# Patient Record
Sex: Male | Born: 1940 | Race: White | Hispanic: No | Marital: Married | Smoking: Former smoker
Health system: Southern US, Community
[De-identification: ages and names within clinical notes are randomized; demographics above are authoritative.]

## PROBLEM LIST (undated history)

## (undated) DIAGNOSIS — M5030 Other cervical disc degeneration, unspecified cervical region: Secondary | ICD-10-CM

## (undated) DIAGNOSIS — K219 Gastro-esophageal reflux disease without esophagitis: Secondary | ICD-10-CM

## (undated) DIAGNOSIS — M5412 Radiculopathy, cervical region: Secondary | ICD-10-CM

## (undated) DIAGNOSIS — J302 Other seasonal allergic rhinitis: Secondary | ICD-10-CM

## (undated) DIAGNOSIS — R062 Wheezing: Secondary | ICD-10-CM

## (undated) DIAGNOSIS — S22060A Wedge compression fracture of T7-T8 vertebra, initial encounter for closed fracture: Secondary | ICD-10-CM

## (undated) DIAGNOSIS — J45909 Unspecified asthma, uncomplicated: Secondary | ICD-10-CM

## (undated) DIAGNOSIS — E221 Hyperprolactinemia: Secondary | ICD-10-CM

## (undated) DIAGNOSIS — Z9889 Other specified postprocedural states: Secondary | ICD-10-CM

## (undated) DIAGNOSIS — H9190 Unspecified hearing loss, unspecified ear: Secondary | ICD-10-CM

## (undated) DIAGNOSIS — E559 Vitamin D deficiency, unspecified: Secondary | ICD-10-CM

## (undated) DIAGNOSIS — I10 Essential (primary) hypertension: Secondary | ICD-10-CM

## (undated) DIAGNOSIS — H5000 Unspecified esotropia: Secondary | ICD-10-CM

## (undated) DIAGNOSIS — IMO0001 Reserved for inherently not codable concepts without codable children: Secondary | ICD-10-CM

## (undated) DIAGNOSIS — D352 Benign neoplasm of pituitary gland: Secondary | ICD-10-CM

## (undated) DIAGNOSIS — E229 Hyperfunction of pituitary gland, unspecified: Secondary | ICD-10-CM

## (undated) DIAGNOSIS — H5789 Other specified disorders of eye and adnexa: Secondary | ICD-10-CM

## (undated) DIAGNOSIS — M47812 Spondylosis without myelopathy or radiculopathy, cervical region: Secondary | ICD-10-CM

## (undated) DIAGNOSIS — M810 Age-related osteoporosis without current pathological fracture: Secondary | ICD-10-CM

## (undated) DIAGNOSIS — K5790 Diverticulosis of intestine, part unspecified, without perforation or abscess without bleeding: Secondary | ICD-10-CM

## (undated) DIAGNOSIS — F524 Premature ejaculation: Secondary | ICD-10-CM

## (undated) HISTORY — DX: Unspecified asthma, uncomplicated: J45.909

## (undated) HISTORY — DX: Wedge compression fracture of T7-t8 vertebra, initial encounter for closed fracture: S22.060A

## (undated) HISTORY — DX: Hyperprolactinemia: E22.1

## (undated) HISTORY — DX: Spondylosis without myelopathy or radiculopathy, cervical region: M47.812

## (undated) HISTORY — DX: Age-related osteoporosis without current pathological fracture: M81.0

## (undated) HISTORY — DX: Other specified disorders of eye and adnexa: H57.89

## (undated) HISTORY — DX: Essential (primary) hypertension: I10

## (undated) HISTORY — DX: Diverticulosis of intestine, part unspecified, without perforation or abscess without bleeding: K57.90

## (undated) HISTORY — DX: Other cervical disc degeneration, unspecified cervical region: M50.30

## (undated) HISTORY — PX: CARDIAC CATHETERIZATION: SHX172

## (undated) HISTORY — DX: Radiculopathy, cervical region: M54.12

## (undated) HISTORY — DX: Other specified postprocedural states: Z98.890

## (undated) HISTORY — PX: HEMORRHOID SURGERY: SHX153

## (undated) HISTORY — DX: Hyperfunction of pituitary gland, unspecified: E22.9

## (undated) HISTORY — PX: TONSILLECTOMY: SUR1361

## (undated) HISTORY — DX: Premature ejaculation: F52.4

## (undated) HISTORY — DX: Vitamin D deficiency, unspecified: E55.9

## (undated) HISTORY — DX: Unspecified esotropia: H50.00

## (undated) HISTORY — DX: Other seasonal allergic rhinitis: J30.2

## (undated) HISTORY — DX: Benign neoplasm of pituitary gland: D35.2

## (undated) HISTORY — PX: COLONOSCOPY: SHX174

## (undated) HISTORY — DX: Gastro-esophageal reflux disease without esophagitis: K21.9

---

## 1981-10-05 HISTORY — PX: CHOLECYSTECTOMY: SHX55

## 1998-10-09 ENCOUNTER — Ambulatory Visit (HOSPITAL_BASED_OUTPATIENT_CLINIC_OR_DEPARTMENT_OTHER): Admission: RE | Admit: 1998-10-09 | Discharge: 1998-10-09 | Payer: Self-pay | Admitting: Orthopedic Surgery

## 2000-08-19 ENCOUNTER — Ambulatory Visit (HOSPITAL_COMMUNITY): Admission: AD | Admit: 2000-08-19 | Discharge: 2000-08-19 | Payer: Self-pay | Admitting: *Deleted

## 2000-09-01 ENCOUNTER — Ambulatory Visit (HOSPITAL_COMMUNITY): Admission: RE | Admit: 2000-09-01 | Discharge: 2000-09-01 | Payer: Self-pay | Admitting: Internal Medicine

## 2001-09-18 ENCOUNTER — Encounter: Payer: Self-pay | Admitting: Emergency Medicine

## 2001-09-18 ENCOUNTER — Emergency Department (HOSPITAL_COMMUNITY): Admission: EM | Admit: 2001-09-18 | Discharge: 2001-09-18 | Payer: Self-pay | Admitting: Emergency Medicine

## 2002-05-12 ENCOUNTER — Ambulatory Visit (HOSPITAL_COMMUNITY): Admission: RE | Admit: 2002-05-12 | Discharge: 2002-05-12 | Payer: Self-pay | Admitting: Gastroenterology

## 2002-05-12 ENCOUNTER — Encounter (INDEPENDENT_AMBULATORY_CARE_PROVIDER_SITE_OTHER): Payer: Self-pay | Admitting: Specialist

## 2004-09-29 ENCOUNTER — Emergency Department (HOSPITAL_COMMUNITY): Admission: EM | Admit: 2004-09-29 | Discharge: 2004-09-29 | Payer: Self-pay | Admitting: Emergency Medicine

## 2005-08-28 ENCOUNTER — Encounter: Admission: RE | Admit: 2005-08-28 | Discharge: 2005-08-28 | Payer: Self-pay | Admitting: Internal Medicine

## 2005-10-05 HISTORY — PX: OTHER SURGICAL HISTORY: SHX169

## 2006-08-05 ENCOUNTER — Encounter: Admission: RE | Admit: 2006-08-05 | Discharge: 2006-08-05 | Payer: Self-pay | Admitting: Internal Medicine

## 2006-08-05 DIAGNOSIS — S22060A Wedge compression fracture of T7-T8 vertebra, initial encounter for closed fracture: Secondary | ICD-10-CM

## 2006-08-05 HISTORY — PX: BACK SURGERY: SHX140

## 2006-08-05 HISTORY — DX: Wedge compression fracture of T7-T8 vertebra, initial encounter for closed fracture: S22.060A

## 2006-08-25 ENCOUNTER — Ambulatory Visit (HOSPITAL_COMMUNITY): Admission: RE | Admit: 2006-08-25 | Discharge: 2006-08-25 | Payer: Self-pay | Admitting: Internal Medicine

## 2006-08-27 ENCOUNTER — Ambulatory Visit (HOSPITAL_COMMUNITY): Admission: RE | Admit: 2006-08-27 | Discharge: 2006-08-27 | Payer: Self-pay | Admitting: Radiology

## 2006-09-07 ENCOUNTER — Encounter: Admission: RE | Admit: 2006-09-07 | Discharge: 2006-09-07 | Payer: Self-pay | Admitting: Internal Medicine

## 2006-10-05 DIAGNOSIS — Z9889 Other specified postprocedural states: Secondary | ICD-10-CM

## 2006-10-05 HISTORY — DX: Other specified postprocedural states: Z98.890

## 2006-12-02 ENCOUNTER — Encounter: Admission: RE | Admit: 2006-12-02 | Discharge: 2006-12-02 | Payer: Self-pay | Admitting: Neurology

## 2006-12-03 ENCOUNTER — Encounter: Admission: RE | Admit: 2006-12-03 | Discharge: 2006-12-03 | Payer: Self-pay | Admitting: Radiology

## 2007-01-04 ENCOUNTER — Encounter: Admission: RE | Admit: 2007-01-04 | Discharge: 2007-03-25 | Payer: Self-pay | Admitting: Internal Medicine

## 2007-03-07 ENCOUNTER — Encounter: Admission: RE | Admit: 2007-03-07 | Discharge: 2007-03-07 | Payer: Self-pay | Admitting: Internal Medicine

## 2009-02-26 ENCOUNTER — Encounter: Admission: RE | Admit: 2009-02-26 | Discharge: 2009-02-26 | Payer: Self-pay | Admitting: Internal Medicine

## 2009-08-21 ENCOUNTER — Encounter: Admission: RE | Admit: 2009-08-21 | Discharge: 2009-08-21 | Payer: Self-pay | Admitting: Otolaryngology

## 2010-04-08 ENCOUNTER — Encounter: Admission: RE | Admit: 2010-04-08 | Discharge: 2010-04-08 | Payer: Self-pay | Admitting: Internal Medicine

## 2011-02-20 NOTE — Consult Note (Signed)
NAME:  Jerome, [name] NO.:  [id number]   MEDICAL RECORD NO.:  [id number]          PATIENT TYPE:  OUT   LOCATION:  MRI                          FACILITY:  [facility]   PHYSICIAN:  Jerome [name], MDDATE OF BIRTH:  Nov 04, 1940   DATE OF CONSULTATION:  08/25/2006  DATE OF DISCHARGE:                                   CONSULTATION   CHIEF COMPLAINT:  Compression fracture.   HISTORY OF PRESENT ILLNESS:  This is a very pleasant 70 year old male who  developed sudden onset of back pain in early November while carrying a heavy  box up a flight of stairs.  He had a T9 fracture by plain films and MRI was  performed today just prior to the consult.  Dr. Alfredo [name] reviewed the MRI.  He felt that the patient had a T8 compression fracture.  The patient reports  pain for the past 2-1/2 weeks.  Initially his pain was a 10 on a 1-10 scale.  It is now improved to a 6.  However, his activities have been fairly limited  by the pain.  He presents today accompanied by his wife to discuss treatment  options.   PAST MEDICAL HISTORY:  Past medical history significant for hypertension,  diverticular disease, osteoarthritis, allergic rhinitis, history of cervical  radiculopathy, history of asthma.  He had a history of a right eye  hemorrhage treated with laser surgery.  He had a cardiac catheterization in  November 2001 that was essentially normal, this was performed by Dr. Meade Maw.  He has a history of spinal stenosis at L3 and L4 by his MRI  performed November 2006.   PAST SURGICAL HISTORY:  Surgical history is significant for cholecystectomy,  tonsillectomy, hemorrhoid surgery, he had a cyst removed from his right  second finger, he denies any previous problems with anesthesia.   ALLERGIES:  NO KNOWN DRUG ALLERGIES.  HE DID AT ONE TIME HAVE HIVES AFTER  EATING A CRAB.  HE HAS SINCE EATEN CRAB MEAT WITHOUT ANY REACTION.   CURRENT MEDICATIONS INCLUDE:  Advair, Singulair,  Metamucil, Prilosec,  Lotrisone, multivitamins, glucosamine with chondroitin, potassium and  hydrochlorothiazide.   SOCIAL HISTORY:  The patient is married.  He has two children.  He has one  or two alcoholic beverages per week.  He used to smoke cigars in the past  however, he has since quit.  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  He lives in [place].   FAMILY HISTORY:  His mother died at age 41 from pneumonia.  She also had  colon cancer, hypertension, Parkinson's disease and severe osteoporosis.  His father died at age 29 from a CVA.   IMPRESSION AND PLAN:  As noted the patient presents today accompanied by his  wife to discuss treatment options for a T8 compression fracture.  Dr.  Alfredo [name] reviewed his MRI with the patient and his wife.  He pointed out the  area of fracture.  He discussed treatment options including kyphoplasty,  vertebroplasty or more conservative therapy with continued pain medication  management and no intervention.  The procedures were described in detail  along with the risks and benefits.  The patient is anxious to proceed with  the vertebroplasty for relief of pain and stabilization of the fracture.  Intervention has been scheduled for Friday, August 27, 2006 with Dr.  Alfredo [name].  Greater than 40 minutes was spent on this consult.      Delton See, P.A.    ______________________________  Jerome Roberts, MD    DR/MEDQ  D:  08/25/2006  T:  08/25/2006  Job:  [id number]   cc:   Thora Lance, M.D.

## 2011-02-20 NOTE — Cardiovascular Report (Signed)
[address]. [facility]  Patient:    Jerome [name], Jerome [name]                MRN: [id number] Proc. Date: 08/19/00 Adm. Date:  [id number] Attending:  Meade Maw A CC:         Thora Lance, M.D.   Cardiac Catheterization  INDICATIONS:  Chest pain with reversible ischemia in the inferior apical region.  DESCRIPTION OF PROCEDURE:  After obtaining written and informed consent, the patient was brought to the cardiac catheterization lab in the postabsorptive state.  Preoperative sedation was achieved using IV Versed.  The right femoral head was identified using radiographic technique.  Local anesthesia was achieved using 1% Xylocaine.  6 French hemostasis sheath was placed into the right femoral artery using modified Seldinger technique.  Selective coronary angiography was performed using a JL4 and JR4 Judkins catheter and ionic contrast was used.  All catheter exchanges were made over a guide wire. Single plane ventriculogram was performed in the RAO position using a 6 French pigtail curved catheter.  Following the procedure, there was no identifiable coronary artery disease.  The hemostasis sheath was removed.  The patient was transferred to the holding area in satisfactory condition.  FINDINGS:  The aortic pressure was 119/77, LV pressure 119/10.  There was no gradient on pullback.  Single plane ventriculogram revealed normal wall motion with an ejection fraction of 70%, no mitral regurgitation was noted.  CORONARY ANGIOGRAPHY:  The left main coronary artery was short and bifurcated into the left anterior descending and circumflex vessel.  Left anterior descending.  The Left anterior descending was a large artery and gave rise to a large D1, small D2, small D3, and ended as an apical recurrent branch.  There was no significant disease in the left anterior descending or its branches.  Circumflex vessel.  The circumflex vessel was codominant and gave rise  to a large OM1, OM2, OM3, and went on to end as an AV groove vessel.  There was no significant disease in the circumflex or its branches.  Right coronary artery was codominant, small RV marginal 1, small RV marginal 2, moderate PDA, and large PL branch.  There was no significant disease in the right coronary artery or its branches.  IMPRESSION:  Normal coronary artery, false positive stress Cardiolite, normal single plane ventriculogram without mitral regurgitation.  RECOMMENDATION:  Consider other etiologies for his chest pain.  Gastritis, continue with Protonix.  These findings were discussed with both the patient and his wife. DD:  08/19/00 TD:  08/19/00 Job: [id number]

## 2012-12-08 ENCOUNTER — Ambulatory Visit: Payer: Medicare Other | Attending: Internal Medicine

## 2012-12-08 DIAGNOSIS — IMO0001 Reserved for inherently not codable concepts without codable children: Secondary | ICD-10-CM | POA: Insufficient documentation

## 2012-12-08 DIAGNOSIS — R262 Difficulty in walking, not elsewhere classified: Secondary | ICD-10-CM | POA: Insufficient documentation

## 2012-12-08 DIAGNOSIS — M2560 Stiffness of unspecified joint, not elsewhere classified: Secondary | ICD-10-CM | POA: Insufficient documentation

## 2012-12-08 DIAGNOSIS — R293 Abnormal posture: Secondary | ICD-10-CM | POA: Insufficient documentation

## 2012-12-08 DIAGNOSIS — M2550 Pain in unspecified joint: Secondary | ICD-10-CM | POA: Insufficient documentation

## 2012-12-12 ENCOUNTER — Ambulatory Visit: Payer: Medicare Other | Admitting: Physical Therapy

## 2012-12-15 ENCOUNTER — Ambulatory Visit: Payer: Medicare Other | Admitting: Physical Therapy

## 2012-12-19 ENCOUNTER — Ambulatory Visit: Payer: Medicare Other

## 2012-12-22 ENCOUNTER — Ambulatory Visit: Payer: Medicare Other | Admitting: Physical Therapy

## 2012-12-26 ENCOUNTER — Ambulatory Visit: Payer: Medicare Other

## 2012-12-28 ENCOUNTER — Ambulatory Visit: Payer: Medicare Other | Admitting: Physical Therapy

## 2013-01-04 ENCOUNTER — Ambulatory Visit: Payer: Medicare Other | Attending: Internal Medicine

## 2013-01-04 DIAGNOSIS — M2550 Pain in unspecified joint: Secondary | ICD-10-CM | POA: Insufficient documentation

## 2013-01-04 DIAGNOSIS — R262 Difficulty in walking, not elsewhere classified: Secondary | ICD-10-CM | POA: Insufficient documentation

## 2013-01-04 DIAGNOSIS — IMO0001 Reserved for inherently not codable concepts without codable children: Secondary | ICD-10-CM | POA: Insufficient documentation

## 2013-01-04 DIAGNOSIS — R293 Abnormal posture: Secondary | ICD-10-CM | POA: Insufficient documentation

## 2013-01-04 DIAGNOSIS — M2560 Stiffness of unspecified joint, not elsewhere classified: Secondary | ICD-10-CM | POA: Insufficient documentation

## 2013-01-11 ENCOUNTER — Ambulatory Visit: Payer: Medicare Other | Admitting: Physical Therapy

## 2013-01-18 ENCOUNTER — Ambulatory Visit: Payer: Medicare Other | Admitting: Physical Therapy

## 2013-05-05 DEATH — deceased

## 2014-07-09 ENCOUNTER — Telehealth: Payer: Self-pay | Admitting: Hematology

## 2014-07-09 NOTE — Telephone Encounter (Signed)
C/D 10/5 for appt. 10/15

## 2014-07-09 NOTE — Telephone Encounter (Signed)
S/W PATIENT AND GAVE NP APPT FOR 10/15 @ 1:30 W/DR. SEHBAI. REFERRING DR. Jenny Reichmann GRIFFIN DX- BLEEDING EASILY

## 2014-07-19 ENCOUNTER — Ambulatory Visit (HOSPITAL_BASED_OUTPATIENT_CLINIC_OR_DEPARTMENT_OTHER): Payer: Medicare Other

## 2014-07-19 ENCOUNTER — Other Ambulatory Visit (HOSPITAL_BASED_OUTPATIENT_CLINIC_OR_DEPARTMENT_OTHER): Payer: Medicare Other

## 2014-07-19 ENCOUNTER — Telehealth: Payer: Self-pay | Admitting: Hematology

## 2014-07-19 ENCOUNTER — Ambulatory Visit (HOSPITAL_BASED_OUTPATIENT_CLINIC_OR_DEPARTMENT_OTHER): Payer: Medicare Other | Admitting: Hematology

## 2014-07-19 ENCOUNTER — Ambulatory Visit: Payer: Medicare Other

## 2014-07-19 VITALS — BP 148/90 | HR 88 | Temp 97.8°F | Resp 18 | Ht 70.0 in | Wt 166.6 lb

## 2014-07-19 DIAGNOSIS — D699 Hemorrhagic condition, unspecified: Secondary | ICD-10-CM

## 2014-07-19 DIAGNOSIS — M810 Age-related osteoporosis without current pathological fracture: Secondary | ICD-10-CM | POA: Insufficient documentation

## 2014-07-19 DIAGNOSIS — Z9889 Other specified postprocedural states: Secondary | ICD-10-CM

## 2014-07-19 DIAGNOSIS — K068 Other specified disorders of gingiva and edentulous alveolar ridge: Secondary | ICD-10-CM

## 2014-07-19 DIAGNOSIS — I10 Essential (primary) hypertension: Secondary | ICD-10-CM | POA: Insufficient documentation

## 2014-07-19 DIAGNOSIS — J45998 Other asthma: Secondary | ICD-10-CM | POA: Insufficient documentation

## 2014-07-19 DIAGNOSIS — K055 Other periodontal diseases: Secondary | ICD-10-CM

## 2014-07-19 LAB — CBC WITH DIFFERENTIAL/PLATELET
BASO%: 1 % (ref 0.0–2.0)
BASOS ABS: 0.1 10*3/uL (ref 0.0–0.1)
EOS%: 2.8 % (ref 0.0–7.0)
Eosinophils Absolute: 0.2 10*3/uL (ref 0.0–0.5)
HEMATOCRIT: 49.8 % (ref 38.4–49.9)
HGB: 16.4 g/dL (ref 13.0–17.1)
LYMPH#: 1.3 10*3/uL (ref 0.9–3.3)
LYMPH%: 18.9 % (ref 14.0–49.0)
MCH: 30.1 pg (ref 27.2–33.4)
MCHC: 32.9 g/dL (ref 32.0–36.0)
MCV: 91.3 fL (ref 79.3–98.0)
MONO#: 0.6 10*3/uL (ref 0.1–0.9)
MONO%: 9 % (ref 0.0–14.0)
NEUT#: 4.6 10*3/uL (ref 1.5–6.5)
NEUT%: 68.3 % (ref 39.0–75.0)
Platelets: 331 10*3/uL (ref 140–400)
RBC: 5.45 10*6/uL (ref 4.20–5.82)
RDW: 13.7 % (ref 11.0–14.6)
WBC: 6.8 10*3/uL (ref 4.0–10.3)

## 2014-07-19 LAB — PLATELET FUNCTION ASSAY: COLLAGEN / EPINEPHRINE: 109 s (ref 0–184)

## 2014-07-19 LAB — COMPREHENSIVE METABOLIC PANEL (CC13)
ALK PHOS: 67 U/L (ref 40–150)
ALT: 21 U/L (ref 0–55)
AST: 19 U/L (ref 5–34)
Albumin: 3.9 g/dL (ref 3.5–5.0)
Anion Gap: 11 mEq/L (ref 3–11)
BILIRUBIN TOTAL: 0.55 mg/dL (ref 0.20–1.20)
BUN: 11.8 mg/dL (ref 7.0–26.0)
CHLORIDE: 101 meq/L (ref 98–109)
CO2: 29 mEq/L (ref 22–29)
CREATININE: 0.8 mg/dL (ref 0.7–1.3)
Calcium: 10.1 mg/dL (ref 8.4–10.4)
Glucose: 87 mg/dl (ref 70–140)
POTASSIUM: 3.7 meq/L (ref 3.5–5.1)
Sodium: 142 mEq/L (ref 136–145)
TOTAL PROTEIN: 7.2 g/dL (ref 6.4–8.3)

## 2014-07-19 NOTE — Progress Notes (Signed)
[name] CONSULT NOTE DATE OF VISIT: 07/19/2014  Patient Care Team: Irven Shelling, MD as PCP - General (Internal Medicine) Irven Shelling, MD as Referring Physician (Internal Medicine) Marcella Dunnaway Marla Roe, MD as Consulting Physician (Hematology) [facility]  CHIEF COMPLAINTS/PURPOSE OF CONSULTATION:   Excessive bleeding with last dental procedure  HISTORY OF PRESENTING ILLNESS:   Jerome Bell 73 y.o. male is here because of evaluation for a bleeding diathesis. Patient does not have any prior or known history of a bleeding disorder. He has had several surgeries in the past which are listed below and none of them resulted in a bleeding complication. He does not complain of excessive bleeding if he sustained a cut. He does not have spontaneous bruising. He denies any hematuria, hematemesis, melena, hemoptysis, bright red blood per rectum. He denies any episodes of spontaneous epistaxis or any other mucocutaneous bleeding. He has never required a blood transfusion. His CBC results indicate a normal white count, normal hemoglobin and a normal platelet count.   Recently he had his right side upper teeth extracted with minimal bleeding, but a week later when teeth were extracted from the left side it caused excessive bleeding in a long time for clot formation. According to patient the blood was very thin. Patient has not been on any blood thinners notably no aspirin, Aleve, ibuprofen, naproxen or any other nonsteroidal anti-inflammatory drugs. He has not taken any steroids recently. Patient is not on any warfarin or any other blood thinners.  He does state that he has a very poor consumption of fruits and vegetables and I am slightly concerned about the possibility of vitamin C deficiency also known as scurvy which can result in excessive  Bleeding with dental procedures. His platelet count is normal but is there are problem with the  platelet function? We can certainly explore it by doing further testing. He needs some further dental work up and remaining teeth to be extracted and also plans a vacation to the beach in November. He is being sent here to look for any bleeding diathesis.  Interestingly his CBC done on 07/05/2014 showed a white count 7000, hemoglobin 15.8 g, hematocrit 48.8, MCV 94, platelet count 255. His INR was 1.0 prothrombin time 10.6 seconds and PTT was 31 seconds all normal.  Patient does say that his mother who died at age 74 to have some bleeding with dental procedures and will take a medicine for 7-10 days prior to the procedure but he does not know anymore details about what condition she had a what medicine she was taking. He is the only child and no siblings. Patient has 2 daughters who do not have any bleeding problems.   I reviewed his medications carefully and none of them appear to increase the risk of bleeding. Patient does not have any hepatic disease.  MEDICAL HISTORY:  Past Medical History  Diagnosis Date  . Asthma     On albuterol, Advair and Singulair. Spirometry 06/10/14 moderate restriction.  . Hypertension     on HCTZ  . Osteoporosis     Treatment x 8 years and hx of Osteoporotic fracture  . Hyperprolactinemia   . Cervical spondylosis   . GERD (gastroesophageal reflux disease)   . Vitamin D deficiency   . Pituitary microadenoma with hyperprolactinemia   . Wedge compression fracture of T8 vertebra November 2007  . Degenerative disc disease, cervical   . Cervical radiculopathy   . Seasonal allergic rhinitis   .  Premature ejaculation   . H/O cardiac catheterization     2001 normal following abnormal Cardiolite test  . Diverticulosis   . S/P colonoscopy 2008  . Ocular hemorrhage     right side 1975  . Esotropia of left eye     SURGICAL HISTORY: Past Surgical History  Procedure Laterality Date  . Vertebroplasty  2007  . Cholecystectomy  1983  . Back surgery  November 2007     Vertebroplasty T8  . Hemorrhoid surgery      SOCIAL HISTORY: History   Social History  . Marital Status: Married    Spouse Name: N/A    Number of Children: 2  . Years of Education: N/A   Occupational History  . Retired    Social History Main Topics  . Smoking status: Former Smoker    Types: Cigars, Pipe    Quit date: 09/23/2002  . Smokeless tobacco: Not on file  . Alcohol Use: 0.6 oz/week    1 Glasses of wine per week  . Drug Use: Not on file  . Sexual Activity: Not on file   Other Topics Concern  . Not on file   Social History Narrative   Goes to gym 3 x a week and walks daily. 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 Married. 2 daughters.    FAMILY HISTORY: Family History  Problem Relation Age of Onset  . Pneumonia Mother   . Bleeding Disorder Mother   . Colon cancer Mother   . Hypertension Mother   . Osteoporosis Mother   . Parkinson's disease Mother   . Stroke Father     ALLERGIES:  is allergic to protonix.  MEDICATIONS:  Current Outpatient Prescriptions  Medication Sig Dispense Refill  . albuterol (PROVENTIL HFA;VENTOLIN HFA) 108 (90 BASE) MCG/ACT inhaler Inhale 2 puffs into the lungs every 6 (six) hours as needed for wheezing or shortness of breath.      Marland Kitchen alendronate (FOSAMAX) 70 MG tablet Take 70 mg by mouth once a week. Take with a full glass of water on an empty stomach.      . bromocriptine (PARLODEL) 2.5 MG tablet Take by mouth 2 (two) times daily.      . Calcium Carbonate-Vitamin D (CALCIUM-VITAMIN D) 500-200 MG-UNIT per tablet Take 1 tablet by mouth 2 (two) times daily.      . Fluticasone-Salmeterol (ADVAIR) 250-50 MCG/DOSE AEPB Inhale 1 puff into the lungs 2 (two) times daily.      . hydrochlorothiazide (MICROZIDE) 12.5 MG capsule Take 12.5 mg by mouth daily.      . montelukast (SINGULAIR) 10 MG tablet Take 10 mg by mouth at bedtime.      . Multiple Vitamin (MULTIVITAMIN) tablet Take 1 tablet by mouth daily.      . potassium chloride  (K-DUR,KLOR-CON) 10 MEQ tablet Take 10 mEq by mouth once.      . psyllium (METAMUCIL) 58.6 % powder Take 1 packet by mouth 2 (two) times daily.       No current facility-administered medications for this visit.    REVIEW OF SYSTEMS:   Constitutional: Denies fevers, chills or abnormal night sweats Eyes: Denies blurriness of vision, double vision or watery eyes Ears, nose, mouth, throat, and face: Denies mucositis or sore throat Respiratory: Denies cough, dyspnea or wheezes Cardiovascular: Denies palpitation, chest discomfort or lower extremity swelling Gastrointestinal:  Denies nausea, heartburn or change in bowel habits Skin: Denies abnormal skin rashes Lymphatics: Denies new lymphadenopathy or easy bruising Neurological:Denies numbness, tingling or  new weaknesses Behavioral/Psych: Mood is stable, no new changes  All other systems were reviewed with the patient and are negative.  PHYSICAL EXAMINATION: ECOG PERFORMANCE STATUS: 0  Filed Vitals:   07/19/14 1421  BP: 148/90  Pulse: 88  Temp: 97.8 F (36.6 C)  Resp: 18   Filed Weights   07/19/14 1421  Weight: 166 lb 9.6 oz (75.569 kg)    GENERAL:alert, no distress and comfortable SKIN: skin color, texture, turgor are normal, no rashes or significant lesions, no petechiae or bruises. EYES: normal, conjunctiva are pink and non-injected, sclera clear OROPHARYNX:no exudate, no erythema and lips, buccal mucosa, and tongue normal  NECK: supple, thyroid normal size, non-tender, without nodularity LYMPH:  no palpable lymphadenopathy in the cervical, axillary or inguinal LUNGS: clear to auscultation and percussion with normal breathing effort HEART: regular rate & rhythm and no murmurs and no lower extremity edema ABDOMEN:abdomen soft, non-tender and normal bowel sounds, no HSM. Musculoskeletal:no cyanosis of digits and no clubbing  PSYCH: alert & oriented x 3 with fluent speech NEURO: no focal motor/sensory deficits  LABORATORY  DATA:  I have reviewed the data as listed Lab Results  Component Value Date   WBC 6.8 07/19/2014   HGB 16.4 07/19/2014   HCT 49.8 07/19/2014   MCV 91.3 07/19/2014   PLT 331 07/19/2014    Recent Labs  07/19/14 1541  NA 142  K 3.7  CO2 29  GLUCOSE 87  BUN 11.8  CREATININE 0.8  CALCIUM 10.1  PROT 7.2  ALBUMIN 3.9  AST 19  ALT 21  ALKPHOS 67  BILITOT 0.55    ASSESSMENT & PLAN:   1. Lenardo Westwood is a pleasant 73 years old gentleman referred here for workup of a bleeding diathesis prior to getting dental extractions. The last procedure resulted in excessive bleeding.  2. His personal history is not suggestive of bleeding disorder and it will be very unusual for someone to develop a bleeding disorder at age 76. His medications does not cause excessive bleeding and it is possible that the mechanical trauma from the procedure itself causes bleeding the last time.Excessive, profuse bleeding can occur during the extraction procedure if there is accidental tearing or cutting of the large artery or vein. Laceration of inferior alveolar artery or vein during attempted root removal can cause profuse bleeding. The bleeding is also profuse in region of inflammation where the tissues are excessively hyperemic.  Profuse bleeding can occur during the extraction if there is injudicious use of suction apparatus or when the wound is wiped excessively which causes continual removal of blood clots.Thrombin can be applied over the area. It promotes formation of the clot to stop the bleeding. Collagen plug can also be used to control the bleeding. Patients with high blood pressure and those with bleeding disorders bleed profusely after the tooth removal. That's why in cases of elevated blood pressure and bleeding disorders, tooth extraction procedure is not performed  3. If bleeding continues, the site may be anesthetized by nerve block or local infiltration with 2% lidocaine containing 1:100,000  epinephrine. The socket is then curetted to remove the existing clot and to freshen the bone and is irrigated with normal saline. Then the area is sutured under gentle tension. Local hemostatic agents, such as oxidized cellulose, topical thrombin on a gelatin sponge, or microfibrillar collagen, may be placed in the socket before suturing.  4. Aminocaproic acid and tranexamic acid are synthetic derivatives of the amino acid lysine. They inhibit fibrinolysis by blocking the  binding of plasminogen to fibrin and its subsequent activation to plasmin. The oral mucosa is rich in plasminogen activators, and saliva has significant fibrinolytic activity. These agents are useful in preventing clot lysis following oral surgery or dental extraction. They are used as adjuncts to specific systemic therapy that corrects the coagulation factor or platelet abnormality   5. Gelfoam [employer], [place].) is an absorbable gelatin sponge material that holds many times its weight in blood and provides a stable "scaffold" for clot formation. It is placed in tooth sockets in the form of tapered cones rolled from the sheet material. Gelfoam is absorbed within 4-6 weeks with little or no scar tissue formation. It should not be used under epithelial incisions or flaps because it inhibits healing of the epithelial edges.  6. Bleed-X (QAS, [place], [place].) is a hemostatic product containing "microporous polysaccharide hemispheres" (potato starch) that dehydrate blood and accelerate clotting. It can be applied to all types of surgical sites, including tooth sockets. It has been used successfully when Gelfoam cones have been rolled in the dry powder and placed in sockets. There are no known contraindications to its use.  7. Tisseel ([place], [place].) is a fibrin sealant that acts both through its adhesive action and by direct contribution of fibrin to clot formation. Tisseel is technique sensitive and requires special preparation  just before application. It is expensive and is probably best reserved for particularly complicated or difficult dental situations.  8. Cyklokapron (tranexamic acid) [employer]) has also been used successfully in the form of a mouthwash after oral surgical procedures to inhibit postoperative bleeding episodes. As outlined above, it is an inhibitor of fibrinolysis that can be administered parenterally. In addition, the intravenous preparation can be diluted to a 4.8% aqueous solution and used as a mouthwash (4 times daily for 7 days).   9. The most effective - and often underrated - method for achieving hemostasis is pressure. Pressure must be applied at the appropriate location, and gauze should be prewetted to prevent the clot from adhering to it. Patients should be told that the pressure must be maintained for at least 30 minutes, and preferably for an hour, as frequent interruption of pressure will cause bleeding to continue.  10. If we identify a platelet functional problem or aggregation defect or platelets adhesion defect, we can also adminster DDAVP or desmopressin IV infusion prior to procedure.  10. I am doing a thorough testing and following tests have been ordered. I will see him back on 07/30/2014 to go over the results and make specific recommendations based on the lab results. I will be contacting Dr Philipp Ovens on 07/30/2014 to finalize the plan.  Orders Placed This Encounter  Procedures  . CBC with Differential    Standing Status: Future     Number of Occurrences: 1     Standing Expiration Date: 07/19/2016  . Comprehensive metabolic panel (Cmet) - [facility]    Standing Status: Future     Number of Occurrences: 1     Standing Expiration Date: 07/19/2016  . Thrombin time    Standing Status: Future     Number of Occurrences: 1     Standing Expiration Date: 07/19/2016  . Fibrinogen    Standing Status: Future     Number of Occurrences: 1     Standing Expiration Date: 07/19/2016  . Factor  8 assay    Standing Status: Future     Number of Occurrences: 1     Standing Expiration Date: 07/19/2016  .  Platelet function assay    Standing Status: Future     Number of Occurrences: 1     Standing Expiration Date: 07/19/2016  . Protime-INR    Standing Status: Future     Number of Occurrences: 1     Standing Expiration Date: 07/19/2016  . APTT    Standing Status: Future     Number of Occurrences: 1     Standing Expiration Date: 07/19/2016  . Von Willebrand panel    Standing Status: Future     Number of Occurrences: 1     Standing Expiration Date: 07/19/2016  . Vitamin C    Standing Status: Future     Number of Occurrences: 1     Standing Expiration Date: 07/19/2016    All questions were answered. The patient knows to call the clinic with any problems, questions or concerns. I spent 30 minutes counseling the patient face to face. The total time spent in the appointment was 1 hour.     Bernadene Bell, MD Medical Hematologist/Oncologist [facility] Pager: [phone] Office No: [phone]

## 2014-07-19 NOTE — Telephone Encounter (Signed)
gv and printed appt sched and avs for pt for OCT. °

## 2014-07-23 ENCOUNTER — Encounter: Payer: Self-pay | Admitting: *Deleted

## 2014-07-23 NOTE — Progress Notes (Signed)
This RN faxed a progress note from 07/19/2014 to [facility]. Fax # is [phone] and office # is [phone]. Received faxed verification.

## 2014-07-27 LAB — VON WILLEBRAND PANEL
Coagulation Factor VIII: 85 % (ref 73–140)
RISTOCETIN CO-FACTOR, PLASMA: 76 % (ref 42–200)
VON WILLEBRAND ANTIGEN, PLASMA: 91 % (ref 50–217)

## 2014-07-27 LAB — THROMBIN TIME: THROMBIN TIME: 16.6 seconds (ref <21.0)

## 2014-07-27 LAB — VITAMIN C: Vitamin C: 1.2 mg/dL (ref 0.2–1.5)

## 2014-07-27 LAB — FIBRINOGEN: Fibrinogen: 351 mg/dL (ref 204–475)

## 2014-07-27 LAB — PROTHROMBIN TIME
INR: 0.97 (ref <1.50)
Prothrombin Time: 12.9 seconds (ref 11.6–15.2)

## 2014-07-27 LAB — APTT: aPTT: 32 seconds (ref 24–37)

## 2014-07-30 ENCOUNTER — Telehealth: Payer: Self-pay | Admitting: Hematology

## 2014-07-30 ENCOUNTER — Ambulatory Visit (HOSPITAL_BASED_OUTPATIENT_CLINIC_OR_DEPARTMENT_OTHER): Payer: Medicare Other | Admitting: Hematology

## 2014-07-30 VITALS — BP 147/93 | HR 86 | Temp 98.2°F | Resp 18 | Ht 70.0 in | Wt 169.5 lb

## 2014-07-30 DIAGNOSIS — D699 Hemorrhagic condition, unspecified: Secondary | ICD-10-CM

## 2014-07-30 NOTE — Telephone Encounter (Signed)
Per 07/30/14 pof pt to return PRN.

## 2014-07-30 NOTE — Progress Notes (Signed)
[facility] FOLLOW UP NOTE DATE OF VISIT: 07/19/2014  Patient Care Team: Irven Shelling, MD as PCP - General (Internal Medicine) Irven Shelling, MD as Referring Physician (Internal Medicine) Isaiha Asare Marla Roe, MD as Consulting Physician (Hematology) [facility]  CHIEF COMPLAINTS/PURPOSE OF CONSULTATION:   Excessive bleeding with last dental procedure  HISTORY OF PRESENTING ILLNESS:   Jerome Bell 73 y.o. male is here because of evaluation for a bleeding diathesis. Patient does not have any prior or known history of a bleeding disorder. He has had several surgeries in the past which are listed below and none of them resulted in a bleeding complication. He does not complain of excessive bleeding if he sustained a cut. He does not have spontaneous bruising. He denies any hematuria, hematemesis, melena, hemoptysis, bright red blood per rectum. He denies any episodes of spontaneous epistaxis or any other mucocutaneous bleeding. He has never required a blood transfusion. His CBC results indicate a normal white count, normal hemoglobin and a normal platelet count.   Recently he had his right side upper teeth extracted with minimal bleeding, but a week later when teeth were extracted from the left side it caused excessive bleeding in a long time for clot formation. According to patient the blood was very thin. Patient has not been on any blood thinners notably no aspirin, Aleve, ibuprofen, naproxen or any other nonsteroidal anti-inflammatory drugs. He has not taken any steroids recently. Patient is not on any warfarin or any other blood thinners.  He does state that he has a very poor consumption of fruits and vegetables and I am slightly concerned about the possibility of vitamin C deficiency also known as scurvy which can result in excessive  Bleeding with dental procedures. His platelet count is normal but is there are problem with the  platelet function? We can certainly explore it by doing further testing. He needs some further dental work up and remaining teeth to be extracted and also plans a vacation to the beach in November. He is being sent here to look for any bleeding diathesis.  Interestingly his CBC done on 07/05/2014 showed a white count 7000, hemoglobin 15.8 g, hematocrit 48.8, MCV 94, platelet count 255. His INR was 1.0 prothrombin time 10.6 seconds and PTT was 31 seconds all normal.  Patient does say that his mother who died at age 44 to have some bleeding with dental procedures and will take a medicine for 7-10 days prior to the procedure but he does not know anymore details about what condition she had a what medicine she was taking. He is the only child and no siblings. Patient has 2 daughters who do not have any bleeding problems.   I reviewed his medications carefully and none of them appear to increase the risk of bleeding. Patient does not have any hepatic disease.  INTERVAL HISTORY:  Patient's labs were back                       MEDICAL HISTORY:  Past Medical History  Diagnosis Date  . Asthma     On albuterol, Advair and Singulair. Spirometry 06/10/14 moderate restriction.  . Hypertension     on HCTZ  . Osteoporosis     Treatment x 8 years and hx of Osteoporotic fracture  . Hyperprolactinemia   . Cervical spondylosis   . GERD (gastroesophageal reflux disease)   . Vitamin D deficiency   . Pituitary microadenoma with hyperprolactinemia   .  Wedge compression fracture of T8 vertebra November 2007  . Degenerative disc disease, cervical   . Cervical radiculopathy   . Seasonal allergic rhinitis   . Premature ejaculation   . H/O cardiac catheterization     2001 normal following abnormal Cardiolite test  . Diverticulosis   . S/P colonoscopy 2008  . Ocular hemorrhage     right side 1975  . Esotropia of left eye     SURGICAL HISTORY: Past Surgical History  Procedure  Laterality Date  . Vertebroplasty  2007  . Cholecystectomy  1983  . Back surgery  November 2007    Vertebroplasty T8  . Hemorrhoid surgery      SOCIAL HISTORY: History   Social History  . Marital Status: Married    Spouse Name: N/A    Number of Children: 2  . Years of Education: N/A   Occupational History  . Retired    Social History Main Topics  . Smoking status: Former Smoker    Types: Cigars, Pipe    Quit date: 09/23/2002  . Smokeless tobacco: Not on file  . Alcohol Use: 0.6 oz/week    1 Glasses of wine per week  . Drug Use: Not on file  . Sexual Activity: Not on file   Other Topics Concern  . Not on file   Social History Narrative   Goes to gym 3 x a week and walks daily. 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 Married. 2 daughters.    FAMILY HISTORY: Family History  Problem Relation Age of Onset  . Pneumonia Mother   . Bleeding Disorder Mother   . Colon cancer Mother   . Hypertension Mother   . Osteoporosis Mother   . Parkinson's disease Mother   . Stroke Father     ALLERGIES:  is allergic to protonix.  MEDICATIONS:  Current Outpatient Prescriptions  Medication Sig Dispense Refill  . albuterol (PROVENTIL HFA;VENTOLIN HFA) 108 (90 BASE) MCG/ACT inhaler Inhale 2 puffs into the lungs every 6 (six) hours as needed for wheezing or shortness of breath.      Marland Kitchen alendronate (FOSAMAX) 70 MG tablet Take 70 mg by mouth once a week. Take with a full glass of water on an empty stomach.      . bromocriptine (PARLODEL) 2.5 MG tablet Take by mouth 2 (two) times daily.      . Calcium Carbonate-Vitamin D (CALCIUM-VITAMIN D) 500-200 MG-UNIT per tablet Take 1 tablet by mouth 2 (two) times daily.      . Fluticasone-Salmeterol (ADVAIR) 250-50 MCG/DOSE AEPB Inhale 1 puff into the lungs 2 (two) times daily.      . hydrochlorothiazide (MICROZIDE) 12.5 MG capsule Take 12.5 mg by mouth daily.      . montelukast (SINGULAIR) 10 MG tablet Take 10 mg by mouth at bedtime.        . Multiple Vitamin (MULTIVITAMIN) tablet Take 1 tablet by mouth daily.      . potassium chloride (K-DUR,KLOR-CON) 10 MEQ tablet Take 10 mEq by mouth once.      . psyllium (METAMUCIL) 58.6 % powder Take 1 packet by mouth 2 (two) times daily.       No current facility-administered medications for this visit.    REVIEW OF SYSTEMS:   Constitutional: Denies fevers, chills or abnormal night sweats Eyes: Denies blurriness of vision, double vision or watery eyes Ears, nose, mouth, throat, and face: Denies mucositis or sore throat Respiratory: Denies cough, dyspnea or wheezes Cardiovascular: Denies palpitation, chest discomfort or  lower extremity swelling Gastrointestinal:  Denies nausea, heartburn or change in bowel habits Skin: Denies abnormal skin rashes Lymphatics: Denies new lymphadenopathy or easy bruising Neurological:Denies numbness, tingling or new weaknesses Behavioral/Psych: Mood is stable, no new changes  All other systems were reviewed with the patient and are negative.  PHYSICAL EXAMINATION: ECOG PERFORMANCE STATUS: 0  Filed Vitals:   07/30/14 0829  BP: 147/93  Pulse: 86  Temp: 98.2 F (36.8 C)  Resp: 18   Filed Weights   07/30/14 0829  Weight: 169 lb 8 oz (76.885 kg)    GENERAL:alert, no distress and comfortable SKIN: skin color, texture, turgor are normal, no rashes or significant lesions, no petechiae or bruises. EYES: normal, conjunctiva are pink and non-injected, sclera clear OROPHARYNX:no exudate, no erythema and lips, buccal mucosa, and tongue normal  NECK: supple, thyroid normal size, non-tender, without nodularity LYMPH:  no palpable lymphadenopathy in the cervical, axillary or inguinal LUNGS: clear to auscultation and percussion with normal breathing effort HEART: regular rate & rhythm and no murmurs and no lower extremity edema ABDOMEN:abdomen soft, non-tender and normal bowel sounds, no HSM. Musculoskeletal:no cyanosis of digits and no clubbing  PSYCH:  alert & oriented x 3 with fluent speech NEURO: no focal motor/sensory deficits  LABORATORY DATA:  I have reviewed the data as listed Lab Results  Component Value Date   WBC 6.8 07/19/2014   HGB 16.4 07/19/2014   HCT 49.8 07/19/2014   MCV 91.3 07/19/2014   PLT 331 07/19/2014    Recent Labs  07/19/14 1541  NA 142  K 3.7  CO2 29  GLUCOSE 87  BUN 11.8  CREATININE 0.8  CALCIUM 10.1  PROT 7.2  ALBUMIN 3.9  AST 19  ALT 21  ALKPHOS 67  BILITOT 0.55    ASSESSMENT & PLAN:   1. Jerome Bell is a pleasant 73 years old gentleman referred here for workup of a bleeding diathesis prior to getting dental extractions. The last procedure resulted in excessive bleeding.  2. His personal history is not suggestive of bleeding disorder and it will be very unusual for someone to develop a bleeding disorder at age 35. His medications does not cause excessive bleeding and it is possible that the mechanical trauma from the procedure itself causes bleeding the last time.Excessive, profuse bleeding can occur during the extraction procedure if there is accidental tearing or cutting of the large artery or vein. Laceration of inferior alveolar artery or vein during attempted root removal can cause profuse bleeding. The bleeding is also profuse in region of inflammation where the tissues are excessively hyperemic.  Profuse bleeding can occur during the extraction if there is injudicious use of suction apparatus or when the wound is wiped excessively which causes continual removal of blood clots.Thrombin can be applied over the area. It promotes formation of the clot to stop the bleeding. Collagen plug can also be used to control the bleeding. Patients with high blood pressure and those with bleeding disorders bleed profusely after the tooth removal. That's why in cases of elevated blood pressure and bleeding disorders, tooth extraction procedure is not performed  3. If bleeding continues, the site may be  anesthetized by nerve block or local infiltration with 2% lidocaine containing 1:100,000 epinephrine. The socket is then curetted to remove the existing clot and to freshen the bone and is irrigated with normal saline. Then the area is sutured under gentle tension. Local hemostatic agents, such as oxidized cellulose, topical thrombin on a gelatin sponge, or microfibrillar collagen,  may be placed in the socket before suturing.  4. Aminocaproic acid and tranexamic acid are synthetic derivatives of the amino acid lysine. They inhibit fibrinolysis by blocking the binding of plasminogen to fibrin and its subsequent activation to plasmin. The oral mucosa is rich in plasminogen activators, and saliva has significant fibrinolytic activity. These agents are useful in preventing clot lysis following oral surgery or dental extraction. They are used as adjuncts to specific systemic therapy that corrects the coagulation factor or platelet abnormality   5. Gelfoam [employer], [place].) is an absorbable gelatin sponge material that holds many times its weight in blood and provides a stable "scaffold" for clot formation. It is placed in tooth sockets in the form of tapered cones rolled from the sheet material. Gelfoam is absorbed within 4-6 weeks with little or no scar tissue formation. It should not be used under epithelial incisions or flaps because it inhibits healing of the epithelial edges.  6. Bleed-X (QAS, [place], [place].) is a hemostatic product containing "microporous polysaccharide hemispheres" (potato starch) that dehydrate blood and accelerate clotting. It can be applied to all types of surgical sites, including tooth sockets. It has been used successfully when Gelfoam cones have been rolled in the dry powder and placed in sockets. There are no known contraindications to its use.  7. Tisseel ([place], [place].) is a fibrin sealant that acts both through its adhesive action and by direct contribution of  fibrin to clot formation. Tisseel is technique sensitive and requires special preparation just before application. It is expensive and is probably best reserved for particularly complicated or difficult dental situations.  8. Cyklokapron (tranexamic acid) [employer]) has also been used successfully in the form of a mouthwash after oral surgical procedures to inhibit postoperative bleeding episodes. As outlined above, it is an inhibitor of fibrinolysis that can be administered parenterally. In addition, the intravenous preparation can be diluted to a 4.8% aqueous solution and used as a mouthwash (4 times daily for 7 days).   9. The most effective - and often underrated - method for achieving hemostasis is pressure. Pressure must be applied at the appropriate location, and gauze should be prewetted to prevent the clot from adhering to it. Patients should be told that the pressure must be maintained for at least 30 minutes, and preferably for an hour, as frequent interruption of pressure will cause bleeding to continue.  10. If we identify a platelet functional problem or aggregation defect or platelets adhesion defect, we can also adminster DDAVP or desmopressin IV infusion prior to procedure.  10. All tests done for a bleeding diathesis came back fine as detailed above so no specific recommendations at this time except things discussed above.    All questions were answered. The patient knows to call the clinic with any problems, questions or concerns. I spent 15 minutes counseling the patient face to face. The total time spent in the appointment was 30 minutes.     Bernadene Bell, MD Medical Hematologist/Oncologist [place] Pager: [phone] Office No: [phone]

## 2015-02-18 ENCOUNTER — Other Ambulatory Visit: Payer: Self-pay | Admitting: *Deleted

## 2015-02-18 DIAGNOSIS — R079 Chest pain, unspecified: Secondary | ICD-10-CM

## 2015-02-18 DIAGNOSIS — R0602 Shortness of breath: Secondary | ICD-10-CM

## 2015-02-19 ENCOUNTER — Telehealth (HOSPITAL_COMMUNITY): Payer: Self-pay

## 2015-02-19 NOTE — Telephone Encounter (Signed)
Patient given detailed instructions per Myocardial Perfusion Study Information Sheet for test on 02-20-2015 at 8:00. Patient verbalized understanding. Oletta Lamas, Cloris Flippo A

## 2015-02-20 ENCOUNTER — Ambulatory Visit (HOSPITAL_COMMUNITY): Payer: PPO | Attending: Cardiovascular Disease

## 2015-02-20 DIAGNOSIS — R0602 Shortness of breath: Secondary | ICD-10-CM | POA: Insufficient documentation

## 2015-02-20 DIAGNOSIS — I10 Essential (primary) hypertension: Secondary | ICD-10-CM | POA: Insufficient documentation

## 2015-02-20 DIAGNOSIS — R079 Chest pain, unspecified: Secondary | ICD-10-CM | POA: Insufficient documentation

## 2015-02-20 LAB — MYOCARDIAL PERFUSION IMAGING
CHL CUP STRESS STAGE 1 DBP: 109 mmHg
CHL CUP STRESS STAGE 1 SBP: 165 mmHg
CHL CUP STRESS STAGE 1 SPEED: 0 mph
CHL CUP STRESS STAGE 2 DBP: 110 mmHg
CHL CUP STRESS STAGE 2 HR: 88 {beats}/min
CHL CUP STRESS STAGE 2 SBP: 161 mmHg
CHL CUP STRESS STAGE 2 SPEED: 0 mph
CHL CUP STRESS STAGE 3 GRADE: 0 %
CHL CUP STRESS STAGE 4 SPEED: 0 mph
CHL CUP STRESS STAGE 5 GRADE: 0 %
CHL CUP STRESS STAGE 5 HR: 108 {beats}/min
CHL CUP STRESS STAGE 5 SPEED: 0 mph
CHL CUP STRESS STAGE 6 HR: 103 {beats}/min
CHL CUP STRESS STAGE 7 DBP: 87 mmHg
CHL CUP STRESS STAGE 7 HR: 101 {beats}/min
CHL CUP STRESS STAGE 7 SBP: 147 mmHg
CSEPPHR: 108 {beats}/min
CSEPPMHR: 73 %
Estimated workload: 1 METS
LV sys vol: 37 mL
LVDIAVOL: 93 mL
NUC STRESS TID: 0.96
Nuc Stress EF: 60 %
RATE: 0.25
Rest HR: 81 {beats}/min
SDS: 1
SRS: 5
SSS: 6
Stage 1 Grade: 0 %
Stage 1 HR: 86 {beats}/min
Stage 2 Grade: 0 %
Stage 3 HR: 89 {beats}/min
Stage 3 Speed: 0 mph
Stage 4 DBP: 108 mmHg
Stage 4 Grade: 0 %
Stage 4 HR: 106 {beats}/min
Stage 4 SBP: 160 mmHg
Stage 6 DBP: 92 mmHg
Stage 6 Grade: 0 %
Stage 6 SBP: 156 mmHg
Stage 6 Speed: 0 mph
Stage 7 Grade: 0 %
Stage 7 Speed: 0 mph

## 2015-02-20 MED ORDER — TECHNETIUM TC 99M SESTAMIBI GENERIC - CARDIOLITE
33.0000 | Freq: Once | INTRAVENOUS | Status: AC | PRN
  Administered 2015-02-20: 33 via INTRAVENOUS

## 2015-02-20 MED ORDER — TECHNETIUM TC 99M SESTAMIBI GENERIC - CARDIOLITE
11.0000 | Freq: Once | INTRAVENOUS | Status: AC | PRN
  Administered 2015-02-20: 11 via INTRAVENOUS

## 2015-02-20 MED ORDER — REGADENOSON 0.4 MG/5ML IV SOLN
0.4000 mg | Freq: Once | INTRAVENOUS | Status: AC
  Administered 2015-02-20: 0.4 mg via INTRAVENOUS

## 2015-03-05 ENCOUNTER — Ambulatory Visit (HOSPITAL_COMMUNITY): Payer: Self-pay

## 2015-03-12 ENCOUNTER — Ambulatory Visit
Admission: RE | Admit: 2015-03-12 | Discharge: 2015-03-12 | Disposition: A | Discharge status: Home / Self Care | Payer: PPO | Source: Ambulatory Visit | Attending: Internal Medicine | Admitting: Internal Medicine

## 2015-03-12 ENCOUNTER — Other Ambulatory Visit: Payer: Self-pay | Admitting: Internal Medicine

## 2015-03-12 DIAGNOSIS — R0789 Other chest pain: Secondary | ICD-10-CM

## 2015-05-06 DEATH — deceased

## 2016-01-01 ENCOUNTER — Encounter: Payer: Self-pay | Admitting: *Deleted

## 2016-01-07 ENCOUNTER — Encounter
Admission: RE | Disposition: A | Discharge status: Home / Self Care | Payer: Self-pay | Source: Ambulatory Visit | Attending: Ophthalmology

## 2016-01-07 ENCOUNTER — Ambulatory Visit: Payer: PPO | Admitting: Anesthesiology

## 2016-01-07 ENCOUNTER — Ambulatory Visit
Admission: RE | Admit: 2016-01-07 | Discharge: 2016-01-07 | Disposition: A | Discharge status: Home / Self Care | Payer: PPO | Source: Ambulatory Visit | Attending: Ophthalmology | Admitting: Ophthalmology

## 2016-01-07 ENCOUNTER — Encounter: Payer: Self-pay | Admitting: *Deleted

## 2016-01-07 DIAGNOSIS — M1990 Unspecified osteoarthritis, unspecified site: Secondary | ICD-10-CM | POA: Diagnosis not present

## 2016-01-07 DIAGNOSIS — I10 Essential (primary) hypertension: Secondary | ICD-10-CM | POA: Insufficient documentation

## 2016-01-07 DIAGNOSIS — H2511 Age-related nuclear cataract, right eye: Secondary | ICD-10-CM | POA: Diagnosis present

## 2016-01-07 DIAGNOSIS — J45909 Unspecified asthma, uncomplicated: Secondary | ICD-10-CM | POA: Diagnosis not present

## 2016-01-07 DIAGNOSIS — Z87891 Personal history of nicotine dependence: Secondary | ICD-10-CM | POA: Insufficient documentation

## 2016-01-07 DIAGNOSIS — K5790 Diverticulosis of intestine, part unspecified, without perforation or abscess without bleeding: Secondary | ICD-10-CM | POA: Insufficient documentation

## 2016-01-07 DIAGNOSIS — R062 Wheezing: Secondary | ICD-10-CM | POA: Diagnosis not present

## 2016-01-07 DIAGNOSIS — R0602 Shortness of breath: Secondary | ICD-10-CM | POA: Diagnosis not present

## 2016-01-07 DIAGNOSIS — K219 Gastro-esophageal reflux disease without esophagitis: Secondary | ICD-10-CM | POA: Diagnosis not present

## 2016-01-07 DIAGNOSIS — Z9049 Acquired absence of other specified parts of digestive tract: Secondary | ICD-10-CM | POA: Insufficient documentation

## 2016-01-07 DIAGNOSIS — M810 Age-related osteoporosis without current pathological fracture: Secondary | ICD-10-CM | POA: Diagnosis not present

## 2016-01-07 DIAGNOSIS — H9190 Unspecified hearing loss, unspecified ear: Secondary | ICD-10-CM | POA: Insufficient documentation

## 2016-01-07 HISTORY — DX: Reserved for inherently not codable concepts without codable children: IMO0001

## 2016-01-07 HISTORY — DX: Unspecified hearing loss, unspecified ear: H91.90

## 2016-01-07 HISTORY — DX: Wheezing: R06.2

## 2016-01-07 HISTORY — PX: CATARACT EXTRACTION W/PHACO: SHX586

## 2016-01-07 SURGERY — PHACOEMULSIFICATION, CATARACT, WITH IOL INSERTION
Anesthesia: Monitor Anesthesia Care | Laterality: Right

## 2016-01-07 MED ORDER — FENTANYL CITRATE (PF) 100 MCG/2ML IJ SOLN
INTRAMUSCULAR | Status: DC | PRN
  Administered 2016-01-07: 50 ug via INTRAVENOUS

## 2016-01-07 MED ORDER — CARBACHOL 0.01 % IO SOLN
INTRAOCULAR | Status: DC | PRN
  Administered 2016-01-07: 0.5 mL via INTRAOCULAR

## 2016-01-07 MED ORDER — ARMC OPHTHALMIC DILATING GEL
OPHTHALMIC | Status: AC
  Administered 2016-01-07: 1 via OPHTHALMIC
  Filled 2016-01-07: qty 0.25

## 2016-01-07 MED ORDER — CEFUROXIME OPHTHALMIC INJECTION 1 MG/0.1 ML
INJECTION | OPHTHALMIC | Status: DC | PRN
  Administered 2016-01-07: .1 mL via INTRACAMERAL

## 2016-01-07 MED ORDER — CEFUROXIME OPHTHALMIC INJECTION 1 MG/0.1 ML
INJECTION | OPHTHALMIC | Status: AC
  Filled 2016-01-07: qty 0.1

## 2016-01-07 MED ORDER — ARMC OPHTHALMIC DILATING GEL
1.0000 "application " | OPHTHALMIC | Status: AC | PRN
  Administered 2016-01-07 (×2): 1 via OPHTHALMIC

## 2016-01-07 MED ORDER — EPINEPHRINE HCL 1 MG/ML IJ SOLN
INTRAMUSCULAR | Status: AC
  Filled 2016-01-07: qty 1

## 2016-01-07 MED ORDER — NA CHONDROIT SULF-NA HYALURON 40-17 MG/ML IO SOLN
INTRAOCULAR | Status: AC
  Filled 2016-01-07: qty 1

## 2016-01-07 MED ORDER — POVIDONE-IODINE 5 % OP SOLN
OPHTHALMIC | Status: AC
  Administered 2016-01-07: 1 via OPHTHALMIC
  Filled 2016-01-07: qty 30

## 2016-01-07 MED ORDER — EPINEPHRINE HCL 1 MG/ML IJ SOLN
INTRAMUSCULAR | Status: DC | PRN
  Administered 2016-01-07: 200 mL via OPHTHALMIC

## 2016-01-07 MED ORDER — MOXIFLOXACIN HCL 0.5 % OP SOLN
OPHTHALMIC | Status: AC
  Filled 2016-01-07: qty 3

## 2016-01-07 MED ORDER — MIDAZOLAM HCL 2 MG/2ML IJ SOLN
INTRAMUSCULAR | Status: DC | PRN
  Administered 2016-01-07: 1 mg via INTRAVENOUS

## 2016-01-07 MED ORDER — SODIUM CHLORIDE 0.9 % IV SOLN
INTRAVENOUS | Status: DC
  Administered 2016-01-07 (×2): via INTRAVENOUS

## 2016-01-07 MED ORDER — POVIDONE-IODINE 5 % OP SOLN
1.0000 "application " | Freq: Once | OPHTHALMIC | Status: AC
  Administered 2016-01-07: 1 via OPHTHALMIC

## 2016-01-07 MED ORDER — MOXIFLOXACIN HCL 0.5 % OP SOLN
OPHTHALMIC | Status: DC | PRN
  Administered 2016-01-07: 2 [drp] via OPHTHALMIC

## 2016-01-07 MED ORDER — NA CHONDROIT SULF-NA HYALURON 40-17 MG/ML IO SOLN
INTRAOCULAR | Status: DC | PRN
  Administered 2016-01-07: 1 mL via INTRAOCULAR

## 2016-01-07 MED ORDER — TETRACAINE HCL 0.5 % OP SOLN
OPHTHALMIC | Status: AC
  Administered 2016-01-07: 1 [drp] via OPHTHALMIC
  Filled 2016-01-07: qty 2

## 2016-01-07 MED ORDER — TETRACAINE HCL 0.5 % OP SOLN
1.0000 [drp] | Freq: Once | OPHTHALMIC | Status: AC
  Administered 2016-01-07: 1 [drp] via OPHTHALMIC

## 2016-01-07 SURGICAL SUPPLY — 23 items
CANNULA ANT/CHMB 27G (MISCELLANEOUS) ×1 IMPLANT
CANNULA ANT/CHMB 27GA (MISCELLANEOUS) ×3 IMPLANT
CUP MEDICINE 2OZ PLAST GRAD ST (MISCELLANEOUS) ×1 IMPLANT
GLOVE BIO SURGEON STRL SZ8 (GLOVE) ×3 IMPLANT
GLOVE BIOGEL M 6.5 STRL (GLOVE) ×3 IMPLANT
GLOVE SURG LX 8.0 MICRO (GLOVE) ×2
GLOVE SURG LX STRL 8.0 MICRO (GLOVE) ×1 IMPLANT
GOWN STRL REUS W/ TWL LRG LVL3 (GOWN DISPOSABLE) ×2 IMPLANT
GOWN STRL REUS W/TWL LRG LVL3 (GOWN DISPOSABLE) ×6
LENS IOL TECNIS 24.5 (Intraocular Lens) ×3 IMPLANT
LENS IOL TECNIS MONO 1P 24.5 (Intraocular Lens) IMPLANT
PACK CATARACT (MISCELLANEOUS) ×3 IMPLANT
PACK CATARACT BRASINGTON LX (MISCELLANEOUS) ×3 IMPLANT
PACK EYE AFTER SURG (MISCELLANEOUS) ×3 IMPLANT
SOL BSS BAG (MISCELLANEOUS) ×3
SOL PREP PVP 2OZ (MISCELLANEOUS)
SOLUTION BSS BAG (MISCELLANEOUS) ×1 IMPLANT
SOLUTION PREP PVP 2OZ (MISCELLANEOUS) ×1 IMPLANT
SYR 3ML LL SCALE MARK (SYRINGE) ×3 IMPLANT
SYR 5ML LL (SYRINGE) ×3 IMPLANT
SYR TB 1ML 27GX1/2 LL (SYRINGE) ×3 IMPLANT
WATER STERILE IRR 1000ML POUR (IV SOLUTION) ×3 IMPLANT
WIPE NON LINTING 3.25X3.25 (MISCELLANEOUS) ×3 IMPLANT

## 2016-01-07 NOTE — H&P (Signed)
All labs reviewed. Abnormal studies sent to patients PCP when indicated.  Previous H&P reviewed, patient examined, there are NO CHANGES.  Jerome Lamba LOUIS4/4/20178:56 AM

## 2016-01-07 NOTE — Op Note (Signed)
PREOPERATIVE DIAGNOSIS:  Nuclear sclerotic cataract of the right eye.   POSTOPERATIVE DIAGNOSIS: nuclear sclerotic cataract riht eye   OPERATIVE PROCEDURE:  Procedure(s): CATARACT EXTRACTION PHACO AND INTRAOCULAR LENS PLACEMENT (IOC)   SURGEON:  Birder Robson, MD.   ANESTHESIA:  Anesthesiologist: Gunnar Fusi, MD CRNA: Nelda Marseille, CRNA  1.      Managed anesthesia care. 2.      Topical tetracaine drops followed by 2% Xylocaine jelly applied in the preoperative holding area.   COMPLICATIONS:  None.   TECHNIQUE:   Stop and chop   DESCRIPTION OF PROCEDURE:  The patient was examined and consented in the preoperative holding area where the aforementioned topical anesthesia was applied to the right eye and then brought back to the Operating Room where the right eye was prepped and draped in the usual sterile ophthalmic fashion and a lid speculum was placed. A paracentesis was created with the side port blade and the anterior chamber was filled with viscoelastic. A near clear corneal incision was performed with the steel keratome. A continuous curvilinear capsulorrhexis was performed with a cystotome followed by the capsulorrhexis forceps. Hydrodissection and hydrodelineation were carried out with BSS on a blunt cannula. The lens was removed in a stop and chop  technique and the remaining cortical material was removed with the irrigation-aspiration handpiece. The capsular bag was inflated with viscoelastic and the Technis [id number]  lens was placed in the capsular bag without complication. The remaining viscoelastic was removed from the eye with the irrigation-aspiration handpiece. The wounds were hydrated. The anterior chamber was flushed with Miostat and the eye was inflated to physiologic pressure. 0.1 mL of cefuroxime concentration 10 mg/mL was placed in the anterior chamber. The wounds were found to be water tight. The eye was dressed with Vigamox. The patient was given protective glasses to  wear throughout the day and a shield with which to sleep tonight. The patient was also given drops with which to begin a drop regimen today and will follow-up with me in one day.  Implant Name Type Inv. Item Serial No. Manufacturer Lot No. LRB No. Used  LENS IOL TECNIS 24.5 - [id number] Intraocular Lens LENS IOL TECNIS 24.5 [id number]   Right 1   Procedure(s) with comments: CATARACT EXTRACTION PHACO AND INTRAOCULAR LENS PLACEMENT (IOC) (Right) - [place]   1:04.9 AP%  15.5 CDE   10.08 fluid pack lot # [id number]  EXP 07/04/2017  Electronically signed: Anaijah Augsburger LOUIS 01/07/2016 9:25 AM

## 2016-01-07 NOTE — Discharge Instructions (Signed)
AMBULATORY SURGERY  °DISCHARGE INSTRUCTIONS ° ° °1) The drugs that you were given will stay in your system until tomorrow so for the next 24 hours you should not: ° °A) Drive an automobile °B) Make any legal decisions °C) Drink any alcoholic beverage ° ° °2) You may resume regular meals tomorrow.  Today it is better to start with liquids and gradually work up to solid foods. ° °You may eat anything you prefer, but it is better to start with liquids, then soup and crackers, and gradually work up to solid foods. ° ° °3) Please notify your doctor immediately if you have any unusual bleeding, trouble breathing, redness and pain at the surgery site, drainage, fever, or pain not relieved by medication. ° ° ° °4) Additional Instructions: ° ° ° ° ° ° ° °Please contact your physician with any problems or Same Day Surgery at [phone], Monday through Friday 6 am to 4 pm, or [place] [facility] Main number at [phone].AMBULATORY SURGERY  °DISCHARGE INSTRUCTIONS ° ° °5) The drugs that you were given will stay in your system until tomorrow so for the next 24 hours you should not: ° °D) Drive an automobile °E) Make any legal decisions °F) Drink any alcoholic beverage ° ° °6) You may resume regular meals tomorrow.  Today it is better to start with liquids and gradually work up to solid foods. ° °You may eat anything you prefer, but it is better to start with liquids, then soup and crackers, and gradually work up to solid foods. ° ° °7) Please notify your doctor immediately if you have any unusual bleeding, trouble breathing, redness and pain at the surgery site, drainage, fever, or pain not relieved by medication. ° ° ° °8) Additional Instructions: ° ° ° ° ° ° ° °Please contact your physician with any problems or Same Day Surgery at [phone], Monday through Friday 6 am to 4 pm, or [place] [facility] [place] Main number at [phone]. °

## 2016-01-07 NOTE — Anesthesia Postprocedure Evaluation (Signed)
Anesthesia Post Note  Patient: Jerome Bell  Procedure(s) Performed: Procedure(s) (LRB): CATARACT EXTRACTION PHACO AND INTRAOCULAR LENS PLACEMENT (IOC) (Right)  Patient location during evaluation: PACU Anesthesia Type: MAC Level of consciousness: awake, awake and alert and oriented Pain management: pain level controlled Vital Signs Assessment: post-procedure vital signs reviewed and stable Respiratory status: spontaneous breathing, nonlabored ventilation and respiratory function stable Cardiovascular status: blood pressure returned to baseline Postop Assessment: no headache Anesthetic complications: no    Last Vitals:  Filed Vitals:   01/07/16 0742  BP: 154/91  Pulse: 78  Temp: 36.4 C  Resp: 18    Last Pain: There were no vitals filed for this visit.               Valerio Pinard,  [employer]

## 2016-01-07 NOTE — Transfer of Care (Signed)
Immediate Anesthesia Transfer of Care Note  Patient: Jerome Bell  Procedure(s) Performed: Procedure(s) with comments: CATARACT EXTRACTION PHACO AND INTRAOCULAR LENS PLACEMENT (IOC) (Right) - [place]   1:04.9 AP%  15.5 CDE   10.08 fluid pack lot # [id number]  EXP 07/04/2017  Patient Location: PACU  Anesthesia Type:MAC  Level of Consciousness: awake, alert  and oriented  Airway & Oxygen Therapy: Patient Spontanous Breathing  Post-op Assessment: Report given to RN and Post -op Vital signs reviewed and stable  Post vital signs: Reviewed and stable  Last Vitals:  Filed Vitals:   01/07/16 0742  BP: 154/91  Pulse: 78  Temp: 36.4 C  Resp: 18    Complications: No apparent anesthesia complications

## 2016-01-07 NOTE — Anesthesia Procedure Notes (Signed)
Date/Time: 01/07/2016 9:09 AM Performed by: Nelda Marseille Pre-anesthesia Checklist: Patient identified, Emergency Drugs available, Suction available, Patient being monitored and Timeout performed Oxygen Delivery Method: Nasal cannula

## 2016-01-07 NOTE — Anesthesia Preprocedure Evaluation (Signed)
Anesthesia Evaluation  Patient identified by MRN, date of birth, ID band  Reviewed: Allergy & Precautions, NPO status , Patient's Chart, lab work & pertinent test results  History of Anesthesia Complications Negative for: history of anesthetic complications  Airway Mallampati: II       Dental  (+) Upper Dentures, Partial Lower   Pulmonary neg pulmonary ROS, shortness of breath, asthma , former smoker,           Cardiovascular hypertension, Pt. on medications      Neuro/Psych negative neurological ROS     GI/Hepatic Neg liver ROS, GERD  ,  Endo/Other  negative endocrine ROS  Renal/GU negative Renal ROS     Musculoskeletal  (+) Arthritis , Osteoarthritis,    Abdominal   Peds  Hematology negative hematology ROS (+)   Anesthesia Other Findings   Reproductive/Obstetrics                             Anesthesia Physical Anesthesia Plan  ASA: III  Anesthesia Plan: MAC   Post-op Pain Management:    Induction: Intravenous  Airway Management Planned:   Additional Equipment:   Intra-op Plan:   Post-operative Plan:   Informed Consent: I have reviewed the patients History and Physical, chart, labs and discussed the procedure including the risks, benefits and alternatives for the proposed anesthesia with the patient or authorized representative who has indicated his/her understanding and acceptance.     Plan Discussed with:   Anesthesia Plan Comments:         Anesthesia Quick Evaluation

## 2016-01-27 ENCOUNTER — Encounter: Payer: Self-pay | Admitting: *Deleted

## 2016-01-28 ENCOUNTER — Ambulatory Visit: Payer: PPO | Admitting: Anesthesiology

## 2016-01-28 ENCOUNTER — Ambulatory Visit
Admission: RE | Admit: 2016-01-28 | Discharge: 2016-01-28 | Disposition: A | Discharge status: Home / Self Care | Payer: PPO | Source: Ambulatory Visit | Attending: Ophthalmology | Admitting: Ophthalmology

## 2016-01-28 ENCOUNTER — Encounter
Admission: RE | Disposition: A | Discharge status: Home / Self Care | Payer: Self-pay | Source: Ambulatory Visit | Attending: Ophthalmology

## 2016-01-28 DIAGNOSIS — R0602 Shortness of breath: Secondary | ICD-10-CM | POA: Diagnosis not present

## 2016-01-28 DIAGNOSIS — H2512 Age-related nuclear cataract, left eye: Secondary | ICD-10-CM | POA: Insufficient documentation

## 2016-01-28 DIAGNOSIS — Z9841 Cataract extraction status, right eye: Secondary | ICD-10-CM | POA: Insufficient documentation

## 2016-01-28 DIAGNOSIS — M1990 Unspecified osteoarthritis, unspecified site: Secondary | ICD-10-CM | POA: Insufficient documentation

## 2016-01-28 DIAGNOSIS — H9193 Unspecified hearing loss, bilateral: Secondary | ICD-10-CM | POA: Insufficient documentation

## 2016-01-28 DIAGNOSIS — M810 Age-related osteoporosis without current pathological fracture: Secondary | ICD-10-CM | POA: Diagnosis not present

## 2016-01-28 DIAGNOSIS — M5412 Radiculopathy, cervical region: Secondary | ICD-10-CM | POA: Diagnosis not present

## 2016-01-28 DIAGNOSIS — K219 Gastro-esophageal reflux disease without esophagitis: Secondary | ICD-10-CM | POA: Insufficient documentation

## 2016-01-28 DIAGNOSIS — I10 Essential (primary) hypertension: Secondary | ICD-10-CM | POA: Diagnosis not present

## 2016-01-28 DIAGNOSIS — Z9049 Acquired absence of other specified parts of digestive tract: Secondary | ICD-10-CM | POA: Diagnosis not present

## 2016-01-28 DIAGNOSIS — K5790 Diverticulosis of intestine, part unspecified, without perforation or abscess without bleeding: Secondary | ICD-10-CM | POA: Insufficient documentation

## 2016-01-28 DIAGNOSIS — Z87891 Personal history of nicotine dependence: Secondary | ICD-10-CM | POA: Insufficient documentation

## 2016-01-28 DIAGNOSIS — J45909 Unspecified asthma, uncomplicated: Secondary | ICD-10-CM | POA: Diagnosis not present

## 2016-01-28 HISTORY — PX: CATARACT EXTRACTION W/PHACO: SHX586

## 2016-01-28 SURGERY — PHACOEMULSIFICATION, CATARACT, WITH IOL INSERTION
Anesthesia: Monitor Anesthesia Care | Site: Eye | Laterality: Left | Wound class: Clean

## 2016-01-28 MED ORDER — SODIUM CHLORIDE 0.9 % IV SOLN
INTRAVENOUS | Status: DC
Start: 2016-01-28 — End: 2016-01-28
  Administered 2016-01-28: 10:00:00 via INTRAVENOUS

## 2016-01-28 MED ORDER — NA CHONDROIT SULF-NA HYALURON 40-17 MG/ML IO SOLN
INTRAOCULAR | Status: DC | PRN
  Administered 2016-01-28: 1 mL via INTRAOCULAR

## 2016-01-28 MED ORDER — LIDOCAINE HCL (PF) 1 % IJ SOLN
INTRAMUSCULAR | Status: AC
  Filled 2016-01-28: qty 2

## 2016-01-28 MED ORDER — EPINEPHRINE HCL 1 MG/ML IJ SOLN
INTRAOCULAR | Status: DC | PRN
  Administered 2016-01-28: 11:00:00 via OPHTHALMIC

## 2016-01-28 MED ORDER — EPINEPHRINE HCL 1 MG/ML IJ SOLN
INTRAMUSCULAR | Status: AC
  Filled 2016-01-28: qty 1

## 2016-01-28 MED ORDER — ARMC OPHTHALMIC DILATING GEL
1.0000 "application " | OPHTHALMIC | Status: AC | PRN
  Administered 2016-01-28 (×2): 1 via OPHTHALMIC

## 2016-01-28 MED ORDER — TETRACAINE HCL 0.5 % OP SOLN
OPHTHALMIC | Status: AC
  Administered 2016-01-28: 1 [drp] via OPHTHALMIC
  Filled 2016-01-28: qty 2

## 2016-01-28 MED ORDER — POVIDONE-IODINE 5 % OP SOLN
OPHTHALMIC | Status: AC
  Administered 2016-01-28: 1 via OPHTHALMIC
  Filled 2016-01-28: qty 30

## 2016-01-28 MED ORDER — MOXIFLOXACIN HCL 0.5 % OP SOLN: 1.0000 [drp] | OPHTHALMIC | Status: DC | PRN

## 2016-01-28 MED ORDER — CEFUROXIME OPHTHALMIC INJECTION 1 MG/0.1 ML
INJECTION | OPHTHALMIC | Status: AC
  Filled 2016-01-28: qty 0.1

## 2016-01-28 MED ORDER — CEFUROXIME OPHTHALMIC INJECTION 1 MG/0.1 ML
INJECTION | OPHTHALMIC | Status: DC | PRN
  Administered 2016-01-28: 0.1 mL via INTRACAMERAL

## 2016-01-28 MED ORDER — TETRACAINE HCL 0.5 % OP SOLN
1.0000 [drp] | Freq: Once | OPHTHALMIC | Status: AC
  Administered 2016-01-28: 1 [drp] via OPHTHALMIC

## 2016-01-28 MED ORDER — NA CHONDROIT SULF-NA HYALURON 40-17 MG/ML IO SOLN
INTRAOCULAR | Status: AC
  Filled 2016-01-28: qty 1

## 2016-01-28 MED ORDER — CARBACHOL 0.01 % IO SOLN
INTRAOCULAR | Status: DC | PRN
  Administered 2016-01-28: 0.5 mL via INTRAOCULAR

## 2016-01-28 MED ORDER — ARMC OPHTHALMIC DILATING GEL
OPHTHALMIC | Status: AC
  Administered 2016-01-28: 1 via OPHTHALMIC
  Filled 2016-01-28: qty 0.25

## 2016-01-28 MED ORDER — MOXIFLOXACIN HCL 0.5 % OP SOLN
OPHTHALMIC | Status: DC | PRN
  Administered 2016-01-28: 1 [drp] via OPHTHALMIC

## 2016-01-28 MED ORDER — POVIDONE-IODINE 5 % OP SOLN
1.0000 "application " | Freq: Once | OPHTHALMIC | Status: AC
  Administered 2016-01-28: 1 via OPHTHALMIC

## 2016-01-28 MED ORDER — MOXIFLOXACIN HCL 0.5 % OP SOLN
OPHTHALMIC | Status: AC
  Filled 2016-01-28: qty 3

## 2016-01-28 SURGICAL SUPPLY — 23 items
CANNULA ANT/CHMB 27G (MISCELLANEOUS) ×1 IMPLANT
CANNULA ANT/CHMB 27GA (MISCELLANEOUS) ×3 IMPLANT
CUP MEDICINE 2OZ PLAST GRAD ST (MISCELLANEOUS) ×3 IMPLANT
GLOVE BIO SURGEON STRL SZ8 (GLOVE) ×3 IMPLANT
GLOVE BIOGEL M 6.5 STRL (GLOVE) ×3 IMPLANT
GLOVE SURG LX 8.0 MICRO (GLOVE) ×2
GLOVE SURG LX STRL 8.0 MICRO (GLOVE) ×1 IMPLANT
GOWN STRL REUS W/ TWL LRG LVL3 (GOWN DISPOSABLE) ×2 IMPLANT
GOWN STRL REUS W/TWL LRG LVL3 (GOWN DISPOSABLE) ×6
LENS IOL TECNIS 23.5 (Intraocular Lens) ×3 IMPLANT
LENS IOL TECNIS MONO 1P 23.5 (Intraocular Lens) IMPLANT
PACK CATARACT (MISCELLANEOUS) ×3 IMPLANT
PACK CATARACT BRASINGTON LX (MISCELLANEOUS) ×3 IMPLANT
PACK EYE AFTER SURG (MISCELLANEOUS) ×3 IMPLANT
SOL BSS BAG (MISCELLANEOUS) ×3
SOL PREP PVP 2OZ (MISCELLANEOUS) ×3
SOLUTION BSS BAG (MISCELLANEOUS) ×1 IMPLANT
SOLUTION PREP PVP 2OZ (MISCELLANEOUS) ×1 IMPLANT
SYR 3ML LL SCALE MARK (SYRINGE) ×3 IMPLANT
SYR 5ML LL (SYRINGE) ×3 IMPLANT
SYR TB 1ML 27GX1/2 LL (SYRINGE) ×3 IMPLANT
WATER STERILE IRR 1000ML POUR (IV SOLUTION) ×3 IMPLANT
WIPE NON LINTING 3.25X3.25 (MISCELLANEOUS) ×3 IMPLANT

## 2016-01-28 NOTE — Anesthesia Procedure Notes (Signed)
Date/Time: 01/28/2016 10:38 AM Performed by: Kennon Holter Pre-anesthesia Checklist: Timeout performed, Suction available, Emergency Drugs available, Patient identified and Patient being monitored Patient Re-evaluated:Patient Re-evaluated prior to inductionOxygen Delivery Method: Nasal cannula Placement Confirmation: positive ETCO2

## 2016-01-28 NOTE — Anesthesia Postprocedure Evaluation (Signed)
Anesthesia Post Note  Patient: Jerome Bell  Procedure(s) Performed: Procedure(s) (LRB): CATARACT EXTRACTION PHACO AND INTRAOCULAR LENS PLACEMENT (IOC) (Left)  Patient location during evaluation: PACU Anesthesia Type: MAC Level of consciousness: awake and alert and oriented Pain management: pain level controlled Vital Signs Assessment: post-procedure vital signs reviewed and stable Respiratory status: spontaneous breathing Cardiovascular status: blood pressure returned to baseline Anesthetic complications: no    Last Vitals:  Filed Vitals:   01/28/16 1056 01/28/16 1058  BP: 140/85   Pulse: 78   Temp: 37 C 37 C  Resp: 16     Last Pain: There were no vitals filed for this visit.               Tiarna Koppen,[place]

## 2016-01-28 NOTE — Discharge Instructions (Signed)
AMBULATORY SURGERY  DISCHARGE INSTRUCTIONS   1) The drugs that you were given will stay in your system until tomorrow so for the next 24 hours you should not:  A) Drive an automobile B) Make any legal decisions C) Drink any alcoholic beverage   2) You may resume regular meals tomorrow.  Today it is better to start with liquids and gradually work up to solid foods.  You may eat anything you prefer, but it is better to start with liquids, then soup and crackers, and gradually work up to solid foods.   3) Please notify your doctor immediately if you have any unusual bleeding, trouble breathing, redness and pain at the surgery site, drainage, fever, or pain not relieved by medication.    4) Additional Instructions:        Please contact your physician with any problems or Same Day Surgery at [phone], Monday through Friday 6 am to 4 pm, or  at [facility] number at [phone].   Eye Surgery Discharge Instructions  Expect mild scratchy sensation or mild soreness. DO NOT RUB YOUR EYE!  The day of surgery:  Minimal physical activity, but bed rest is not required  No reading, computer work, or close hand work  No bending, lifting, or straining.  May watch TV  For 24 hours:  No driving, legal decisions, or alcoholic beverages  Safety precautions  Eat anything you prefer: It is better to start with liquids, then soup then solid foods.  _____ Eye patch should be worn until postoperative exam tomorrow.  ____ Solar shield eyeglasses should be worn for comfort in the sunlight/patch while sleeping  Resume all regular medications including aspirin or Coumadin if these were discontinued prior to surgery. You may shower, bathe, shave, or wash your hair. Tylenol may be taken for mild discomfort.  Call your doctor if you experience significant pain, nausea, or vomiting, fever > 101 or other signs of infection. [phone] or [phone] Specific  instructions:  Follow-up Information    Follow up with Tim Lair, MD.   Specialty:  Ophthalmology   Why:  April 26 at 9:35am   Contact information:   [address] [place] [address] [phone]

## 2016-01-28 NOTE — H&P (Signed)
  All labs reviewed. Abnormal studies sent to patients PCP when indicated.  Previous H&P reviewed, patient examined, there are NO CHANGES.  Jerome Bell LOUIS4/25/201710:29 AM

## 2016-01-28 NOTE — Transfer of Care (Signed)
Immediate Anesthesia Transfer of Care Note  Patient: Jerome Bell  Procedure(s) Performed: Procedure(s) with comments: CATARACT EXTRACTION PHACO AND INTRAOCULAR LENS PLACEMENT (IOC) (Left) - [place] 1.01 AP% 20.2 CDE 12.49 Fluid Pack Lot # [id number]  Patient Location: PACU and NICU  Anesthesia Type:MAC  Level of Consciousness: awake, alert  and oriented  Airway & Oxygen Therapy: Patient Spontanous Breathing  Post-op Assessment: Report given to RN and Post -op Vital signs reviewed and stable  Post vital signs: Reviewed and stable  Last Vitals:  Filed Vitals:   01/28/16 0928 01/28/16 1058  BP: 146/90   Pulse: 77   Temp: 35.8 C 37 C  Resp: 16     Complications: No apparent anesthesia complications

## 2016-01-28 NOTE — Op Note (Signed)
PREOPERATIVE DIAGNOSIS:  Nuclear sclerotic cataract of the left eye.   POSTOPERATIVE DIAGNOSIS:  LEFT NUCLEAR SCLEROTIC CATARACT   OPERATIVE PROCEDURE:  Procedure(s): CATARACT EXTRACTION PHACO AND INTRAOCULAR LENS PLACEMENT (IOC)   SURGEON:  Birder Robson, MD.   ANESTHESIA:   Anesthesiologist: Alvin Critchley, MD CRNA: Kennon Holter, CRNA  1.      Managed anesthesia care. 2.      Topical tetracaine drops followed by 2% Xylocaine jelly applied in the preoperative holding area.   COMPLICATIONS:  None.   TECHNIQUE:   Stop and chop   DESCRIPTION OF PROCEDURE:  The patient was examined and consented in the preoperative holding area where the aforementioned topical anesthesia was applied to the left eye and then brought back to the Operating Room where the left eye was prepped and draped in the usual sterile ophthalmic fashion and a lid speculum was placed. A paracentesis was created with the side port blade and the anterior chamber was filled with viscoelastic. A near clear corneal incision was performed with the steel keratome. A continuous curvilinear capsulorrhexis was performed with a cystotome followed by the capsulorrhexis forceps. Hydrodissection and hydrodelineation were carried out with BSS on a blunt cannula. The lens was removed in a stop and chop  technique and the remaining cortical material was removed with the irrigation-aspiration handpiece. The capsular bag was inflated with viscoelastic and the Technis [id number] lens was placed in the capsular bag without complication. The remaining viscoelastic was removed from the eye with the irrigation-aspiration handpiece. The wounds were hydrated. The anterior chamber was flushed with Miostat and the eye was inflated to physiologic pressure. 0.1 mL of cefuroxime concentration 10 mg/mL was placed in the anterior chamber. The wounds were found to be water tight. The eye was dressed with Vigamox. The patient was given protective glasses to wear  throughout the day and a shield with which to sleep tonight. The patient was also given drops with which to begin a drop regimen today and will follow-up with me in one day.  Implant Name Type Inv. Item Serial No. Manufacturer Lot No. LRB No. Used  LENS IOL TECNIS 23.5 - [id number] Intraocular Lens LENS IOL TECNIS 23.5 [id number]   Left 1   Procedure(s) with comments: CATARACT EXTRACTION PHACO AND INTRAOCULAR LENS PLACEMENT (IOC) (Left) - [place] 1.01 AP% 20.2 CDE 12.49 Fluid Pack Lot # [id number]  Electronically signed: Capac 01/28/2016 10:55 AM

## 2016-01-28 NOTE — Anesthesia Preprocedure Evaluation (Signed)
Anesthesia Evaluation  Patient identified by MRN, date of birth, ID band Patient awake    Reviewed: Allergy & Precautions, NPO status , Patient's Chart, lab work & pertinent test results  History of Anesthesia Complications Negative for: history of anesthetic complications  Airway Mallampati: II       Dental  (+) Upper Dentures, Partial Lower   Pulmonary neg pulmonary ROS, shortness of breath and with exertion, asthma , former smoker,    Pulmonary exam normal        Cardiovascular hypertension, Pt. on medications Normal cardiovascular exam     Neuro/Psych Cervical radiculopathy  Neuromuscular disease negative neurological ROS  negative psych ROS   GI/Hepatic Neg liver ROS, GERD  Medicated and Controlled,  Endo/Other  negative endocrine ROS  Renal/GU negative Renal ROS  negative genitourinary   Musculoskeletal  (+) Arthritis , Osteoarthritis,    Abdominal Normal abdominal exam  (+)   Peds negative pediatric ROS (+)  Hematology negative hematology ROS (+)   Anesthesia Other Findings   Reproductive/Obstetrics                             Anesthesia Physical  Anesthesia Plan  ASA: III  Anesthesia Plan: MAC   Post-op Pain Management:    Induction: Intravenous  Airway Management Planned:   Additional Equipment:   Intra-op Plan:   Post-operative Plan:   Informed Consent: I have reviewed the patients History and Physical, chart, labs and discussed the procedure including the risks, benefits and alternatives for the proposed anesthesia with the patient or authorized representative who has indicated his/her understanding and acceptance.     Plan Discussed with: CRNA and Surgeon  Anesthesia Plan Comments:         Anesthesia Quick Evaluation

## 2016-02-26 ENCOUNTER — Encounter: Payer: Self-pay | Admitting: Ophthalmology

## 2017-03-27 IMAGING — CR DG THORACIC SPINE 3V
3 series · 3 of 3 positions shown · non-contrast
Comparison: 12/03/2006

CLINICAL DATA: Chest tightness and history of thoracic compression
fracture at T8 with prior vertebroplasty.

EXAM:
THORACIC SPINE - 2 VIEW + SWIMMERS

[t t-spine a.p.]
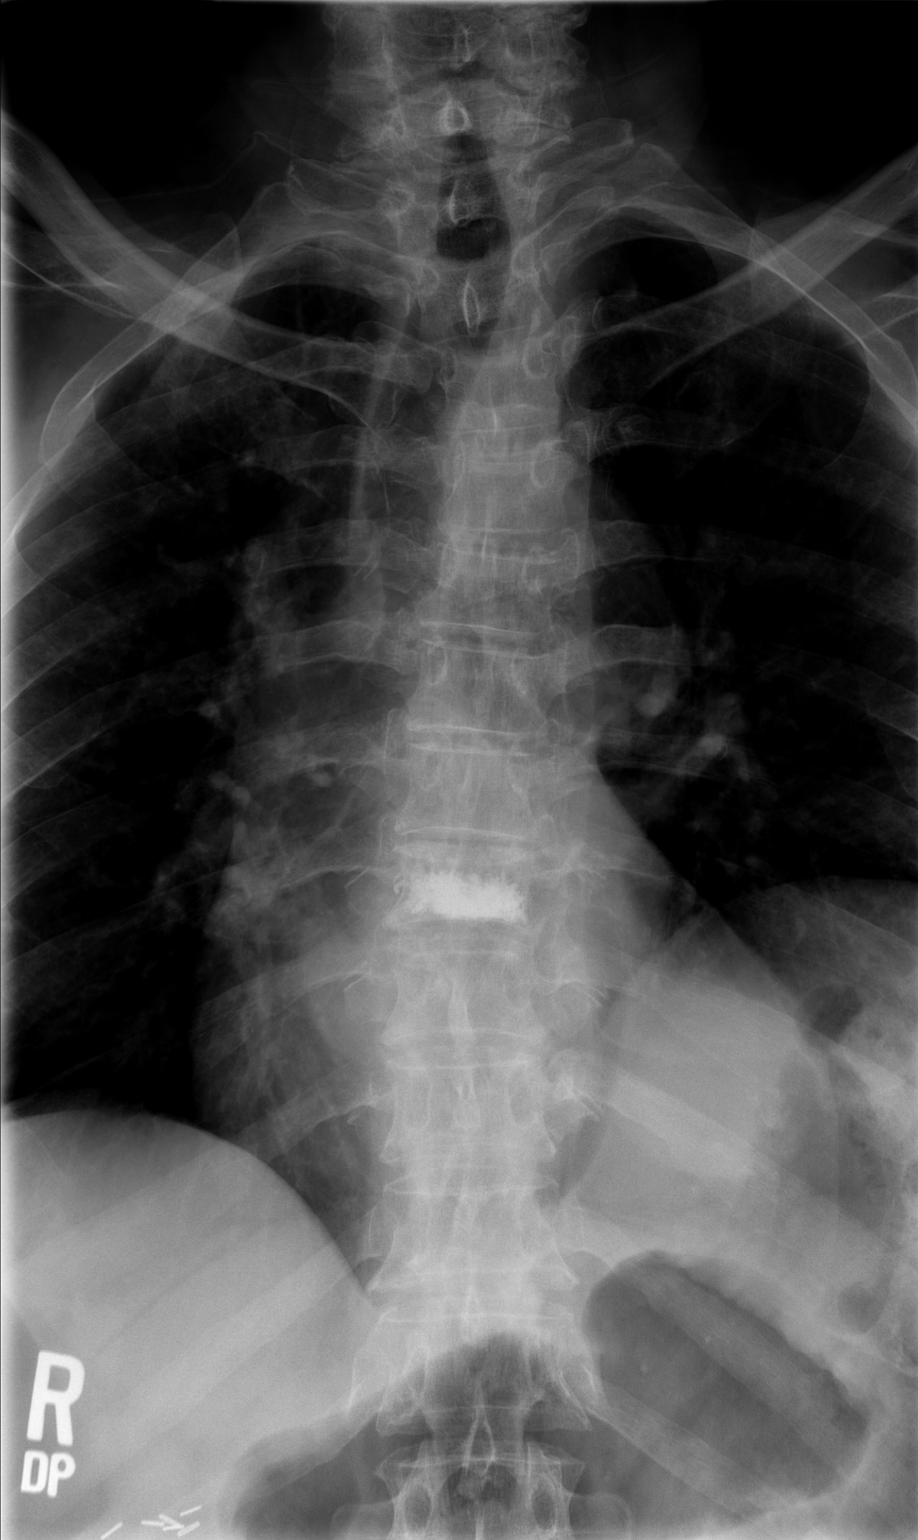

[t t-spine lat *]
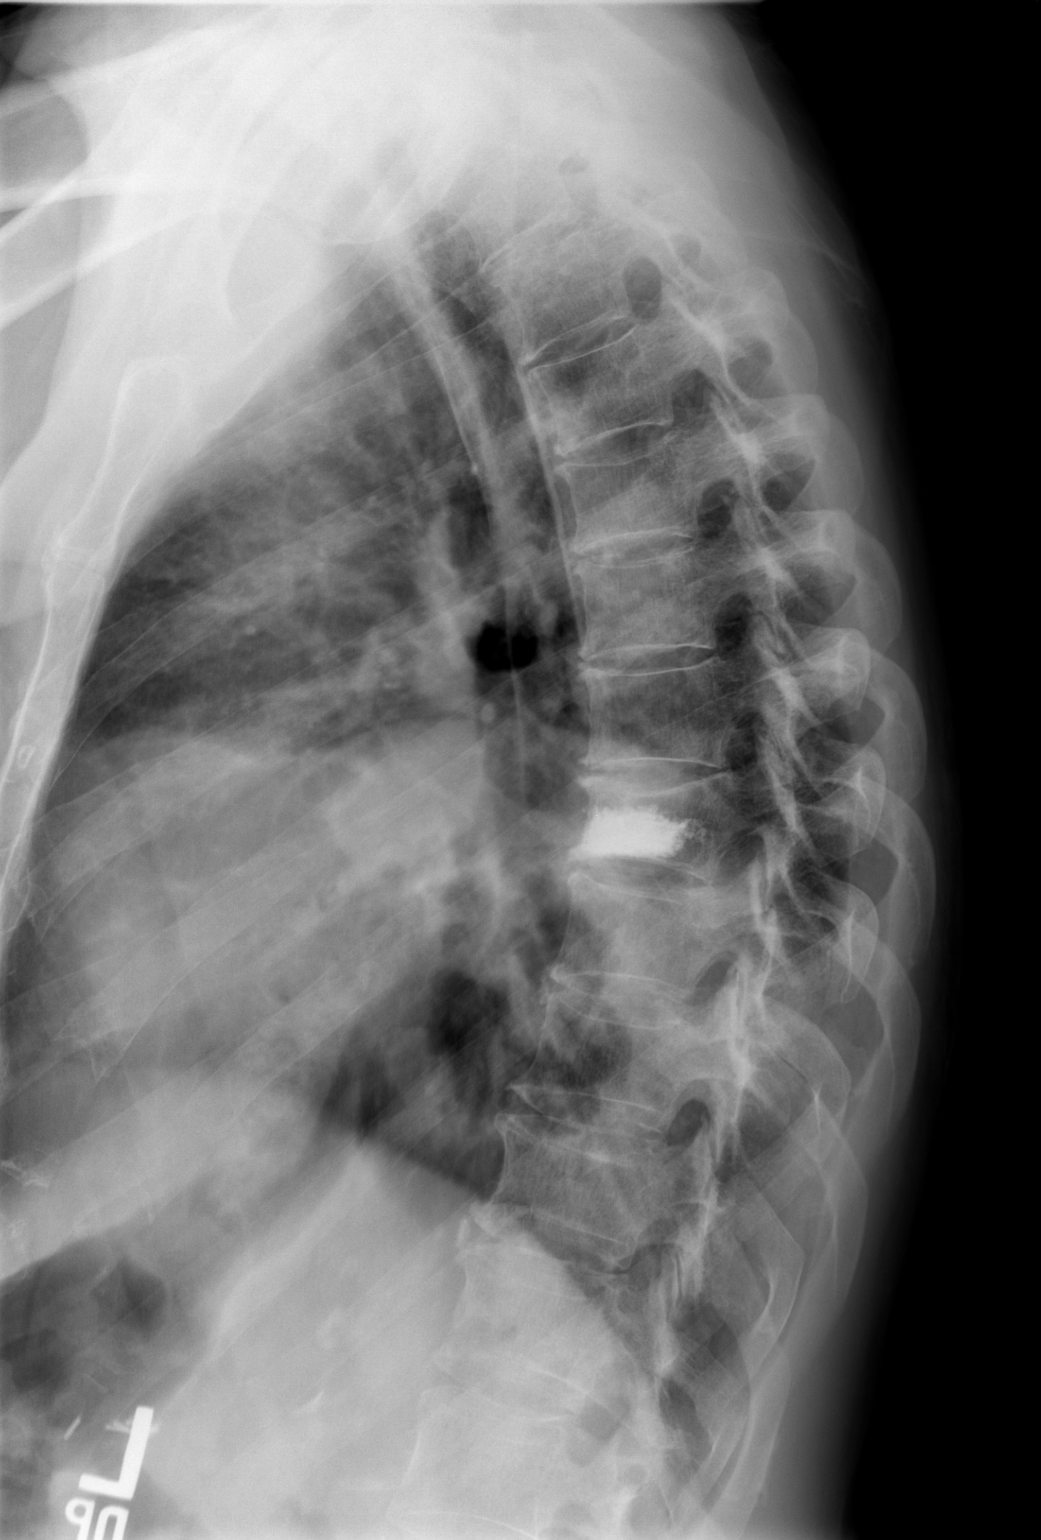

[t swimmers]
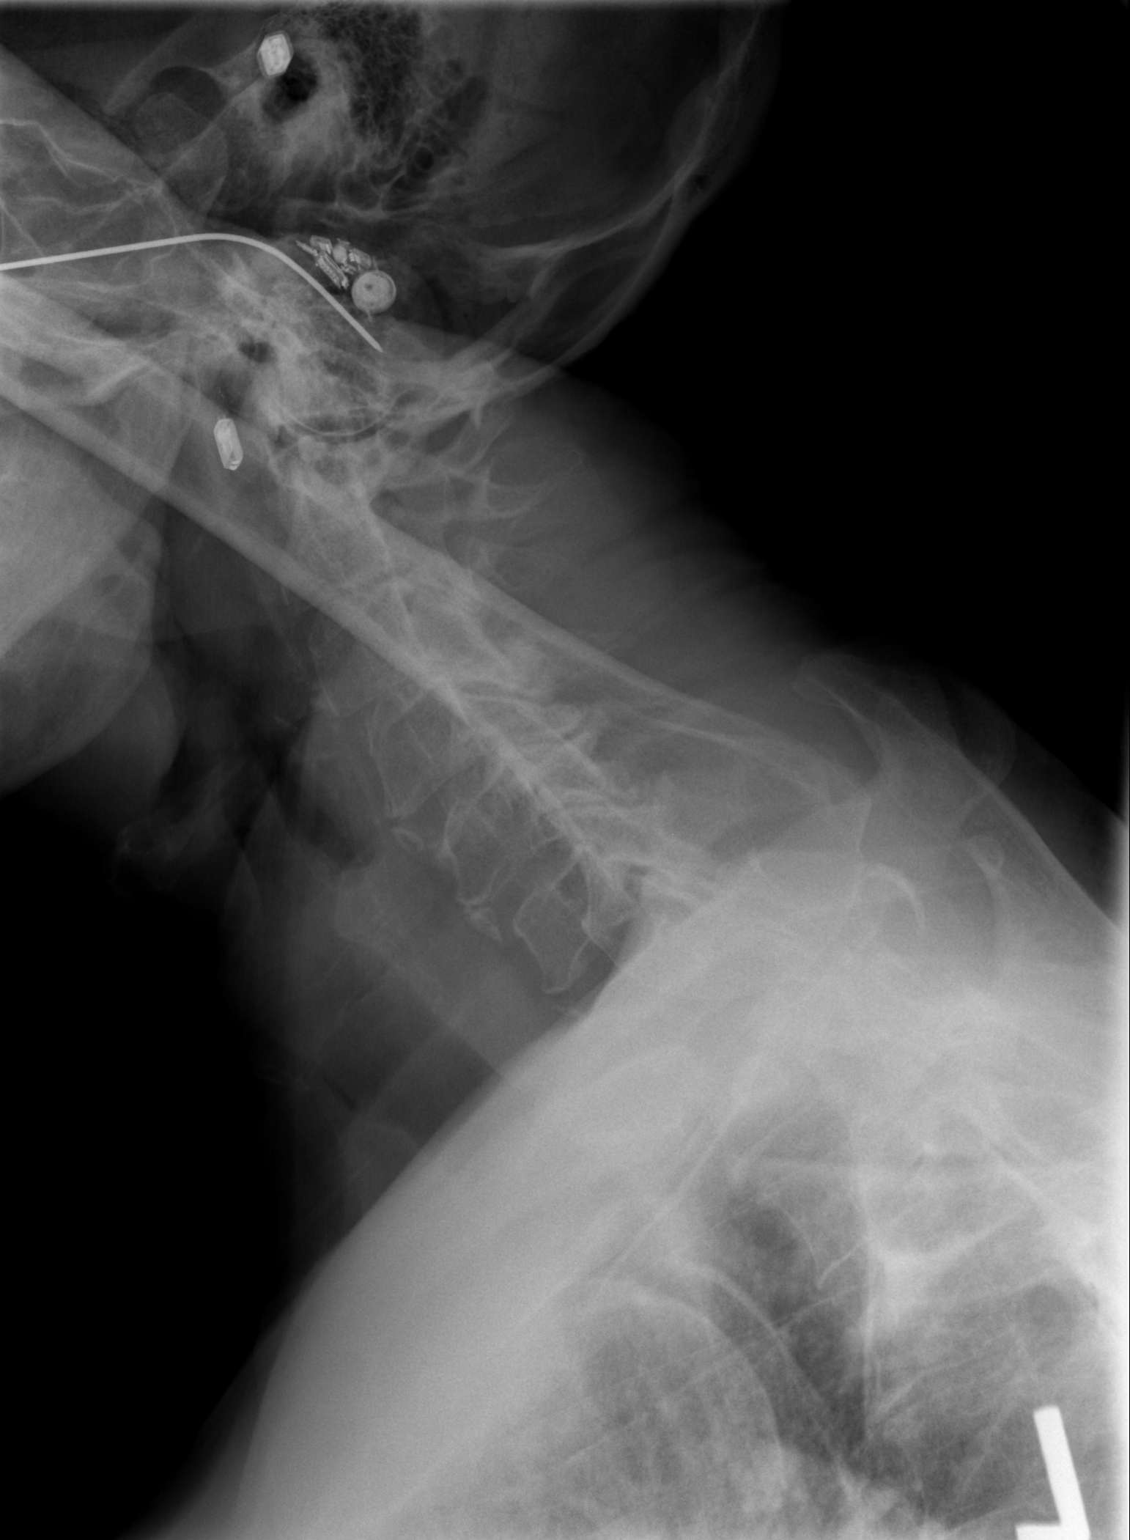

[3 of 3 positions shown; findings below may reference images not displayed]

FINDINGS: There is stable distribution of methylmethacrylate within the T8
vertebral body. Stable mild loss of height of the T8 vertebral body
present without further compression. There is no evidence of new
thoracic compression fracture. Since the prior study, there is some
progression of spondylosis throughout the thoracic spine, with the
most significant proliferative changes at T9-10, T10-11 and T11-12.
No evidence of focal bony lesions or destruction. No paravertebral
soft tissue abnormalities identified.
IMPRESSION: A stable appearance of T8 status post prior vertebral augmentation.
No new thoracic compression fractures. Some progression of thoracic
spondylosis is present, particularly in the lower thoracic spine.
